# Patient Record
Sex: Female | Born: 1960 | Hispanic: No | Smoking: Never smoker
Health system: Southern US, Community
[De-identification: ages and names within clinical notes are randomized; demographics above are authoritative.]

## PROBLEM LIST (undated history)

## (undated) DIAGNOSIS — T7840XA Allergy, unspecified, initial encounter: Secondary | ICD-10-CM

## (undated) DIAGNOSIS — G43909 Migraine, unspecified, not intractable, without status migrainosus: Secondary | ICD-10-CM

## (undated) DIAGNOSIS — E559 Vitamin D deficiency, unspecified: Secondary | ICD-10-CM

## (undated) DIAGNOSIS — D649 Anemia, unspecified: Secondary | ICD-10-CM

## (undated) HISTORY — DX: Anemia, unspecified: D64.9

## (undated) HISTORY — DX: Vitamin D deficiency, unspecified: E55.9

## (undated) HISTORY — DX: Allergy, unspecified, initial encounter: T78.40XA

## (undated) HISTORY — DX: Migraine, unspecified, not intractable, without status migrainosus: G43.909

---

## 1989-11-20 HISTORY — PX: ABDOMINAL HYSTERECTOMY: SHX81

## 2000-08-08 ENCOUNTER — Other Ambulatory Visit: Admission: RE | Admit: 2000-08-08 | Discharge: 2000-08-08 | Payer: Self-pay | Admitting: Obstetrics & Gynecology

## 2003-05-28 ENCOUNTER — Other Ambulatory Visit: Admission: RE | Admit: 2003-05-28 | Discharge: 2003-05-28 | Payer: Self-pay | Admitting: Obstetrics & Gynecology

## 2005-02-23 ENCOUNTER — Other Ambulatory Visit: Admission: RE | Admit: 2005-02-23 | Discharge: 2005-02-23 | Payer: Self-pay | Admitting: Obstetrics & Gynecology

## 2013-10-02 ENCOUNTER — Encounter: Payer: Self-pay | Admitting: Internal Medicine

## 2013-10-02 DIAGNOSIS — T7840XA Allergy, unspecified, initial encounter: Secondary | ICD-10-CM | POA: Insufficient documentation

## 2013-10-02 DIAGNOSIS — D649 Anemia, unspecified: Secondary | ICD-10-CM | POA: Insufficient documentation

## 2013-10-02 DIAGNOSIS — G43909 Migraine, unspecified, not intractable, without status migrainosus: Secondary | ICD-10-CM | POA: Insufficient documentation

## 2013-10-02 DIAGNOSIS — E559 Vitamin D deficiency, unspecified: Secondary | ICD-10-CM | POA: Insufficient documentation

## 2013-10-03 ENCOUNTER — Ambulatory Visit: Payer: BC Managed Care – PPO | Admitting: Physician Assistant

## 2013-10-03 ENCOUNTER — Encounter: Payer: Self-pay | Admitting: Physician Assistant

## 2013-10-03 VITALS — BP 108/70 | HR 72 | Temp 98.1°F | Resp 16 | Ht 70.0 in | Wt 137.0 lb

## 2013-10-03 DIAGNOSIS — R1904 Left lower quadrant abdominal swelling, mass and lump: Secondary | ICD-10-CM

## 2013-10-03 DIAGNOSIS — N3000 Acute cystitis without hematuria: Secondary | ICD-10-CM

## 2013-10-03 DIAGNOSIS — Z23 Encounter for immunization: Secondary | ICD-10-CM

## 2013-10-03 MED ORDER — CIPROFLOXACIN HCL 500 MG PO TABS: 500.0000 mg | ORAL_TABLET | Freq: Two times a day (BID) | ORAL | Status: AC

## 2013-10-03 MED ORDER — LEVOCETIRIZINE DIHYDROCHLORIDE 5 MG PO TABS: 5.0000 mg | ORAL_TABLET | Freq: Every evening | ORAL | Status: DC

## 2013-10-03 MED ORDER — MOMETASONE FUROATE 50 MCG/ACT NA SUSP: 2.0000 | Freq: Every day | NASAL | Status: DC

## 2013-10-03 NOTE — Progress Notes (Signed)
  Subjective:    Patient ID: Margaret Bartlett, female    DOB: [birth year], 52 y.o.   MRN: [id number]  Dysuria  This is a new problem. The current episode started in the past 7 days. The problem occurs every urination. The problem has been gradually worsening. The quality of the pain is described as burning (at the end). The pain is moderate. There has been no fever. She is sexually active. There is no history of pyelonephritis. Associated symptoms include flank pain (left side), frequency, hematuria (brownish), hesitancy and urgency. Pertinent negatives include no chills, discharge, nausea, possible pregnancy, sweats or vomiting. She has tried increased fluids for the symptoms. The treatment provided mild relief. Her past medical history is significant for recurrent UTIs. There is no history of kidney stones.    Past Medical History  Diagnosis Date  . Allergy   . Anemia   . Migraine   . Vitamin D deficiency      Medication List       This list is accurate as of: 10/03/13 10:30 AM.  Always use your most recent med list.               levocetirizine 5 MG tablet  Commonly known as:  XYZAL  Take 5 mg by mouth every evening.     mometasone 50 MCG/ACT nasal spray  Commonly known as:  NASONEX  Place 2 sprays into the nose daily.         Review of Systems  Constitutional: Negative.  Negative for chills.  Respiratory: Negative.   Cardiovascular: Negative.   Gastrointestinal: Positive for abdominal pain (suprapubic). Negative for nausea and vomiting.  Genitourinary: Positive for dysuria, hesitancy, urgency, frequency, hematuria (brownish) and flank pain (left side).  Musculoskeletal: Negative.   Skin: Negative.        Objective:   Physical Exam  Constitutional: She appears well-developed and well-nourished.  HENT:  Head: Normocephalic and atraumatic.  Neck: Normal range of motion. Neck supple.  Cardiovascular: Normal rate and regular rhythm.   Pulmonary/Chest: Effort normal and  breath sounds normal.  Abdominal: Soft. She exhibits mass (On left lower quadrant questionable). She exhibits no distension. There is tenderness. There is no rebound.  Musculoskeletal: Normal range of motion.  Skin: Skin is warm and dry.      Assessment & Plan:  1. Need for prophylactic vaccination and inoculation against influenza - Flu vaccine greater than 3yo with preservative IM (Fluzone trivalent)  2. Abdominal mass, LLQ (left lower quadrant) With abdominal bloating, fatigue, and possible LLQ mass will get an U/S to rule out mass/ovarian cancer.  - US Pelvis Limited; Future - US Transvaginal Non-OB; Future  3. Acute cystitis Hand out given.  - ciprofloxacin (CIPRO) 500 MG tablet; Take 1 tablet (500 mg total) by mouth 2 (two) times daily.  Dispense: 14 tablet; Refill: 0 - Urinalysis, Routine w reflex microscopic - Urine culture

## 2013-10-03 NOTE — Patient Instructions (Signed)
Urinary Tract Infection  Urinary tract infections (UTIs) can develop anywhere along your urinary tract. Your urinary tract is your body's drainage system for removing wastes and extra water. Your urinary tract includes two kidneys, two ureters, a bladder, and a urethra. Your kidneys are a pair of bean-shaped organs. Each kidney is about the size of your fist. They are located below your ribs, one on each side of your spine.  CAUSES  Infections are caused by microbes, which are microscopic organisms, including fungi, viruses, and bacteria. These organisms are so small that they can only be seen through a microscope. Bacteria are the microbes that most commonly cause UTIs.  SYMPTOMS   Symptoms of UTIs may vary by age and gender of the patient and by the location of the infection. Symptoms in young women typically include a frequent and intense urge to urinate and a painful, burning feeling in the bladder or urethra during urination. Older women and men are more likely to be tired, shaky, and weak and have muscle aches and abdominal pain. A fever may mean the infection is in your kidneys. Other symptoms of a kidney infection include pain in your back or sides below the ribs, nausea, and vomiting.  DIAGNOSIS  To diagnose a UTI, your caregiver will ask you about your symptoms. Your caregiver also will ask to provide a urine sample. The urine sample will be tested for bacteria and white blood cells. White blood cells are made by your body to help fight infection.  TREATMENT   Typically, UTIs can be treated with medication. Because most UTIs are caused by a bacterial infection, they usually can be treated with the use of antibiotics. The choice of antibiotic and length of treatment depend on your symptoms and the type of bacteria causing your infection.  HOME CARE INSTRUCTIONS   If you were prescribed antibiotics, take them exactly as your caregiver instructs you. Finish the medication even if you feel better after you  have only taken some of the medication.   Drink enough water and fluids to keep your urine clear or pale yellow.   Avoid caffeine, tea, and carbonated beverages. They tend to irritate your bladder.   Empty your bladder often. Avoid holding urine for long periods of time.   Empty your bladder before and after sexual intercourse.   After a bowel movement, women should cleanse from front to back. Use each tissue only once.  SEEK MEDICAL CARE IF:    You have back pain.   You develop a fever.   Your symptoms do not begin to resolve within 3 days.  SEEK IMMEDIATE MEDICAL CARE IF:    You have severe back pain or lower abdominal pain.   You develop chills.   You have nausea or vomiting.   You have continued burning or discomfort with urination.  MAKE SURE YOU:    Understand these instructions.   Will watch your condition.   Will get help right away if you are not doing well or get worse.  Document Released: 08/16/2005 Document Revised: 05/07/2012 Document Reviewed: 12/15/2011  [employer] Patient Information 2014 [employer].

## 2013-10-04 LAB — URINALYSIS, ROUTINE W REFLEX MICROSCOPIC
Bilirubin Urine: NEGATIVE
Glucose, UA: NEGATIVE mg/dL
Hgb urine dipstick: NEGATIVE
Ketones, ur: NEGATIVE mg/dL
Nitrite: NEGATIVE
Protein, ur: NEGATIVE mg/dL
Specific Gravity, Urine: 1.018 (ref 1.005–1.030)
pH: 7 (ref 5.0–8.0)

## 2013-12-26 ENCOUNTER — Other Ambulatory Visit: Payer: Self-pay | Admitting: Physician Assistant

## 2013-12-26 MED ORDER — NEOMYCIN-POLYMYXIN-DEXAMETH 3.5-10000-0.1 OP SUSP: 2.0000 [drp] | Freq: Four times a day (QID) | OPHTHALMIC | Status: DC

## 2014-01-16 ENCOUNTER — Ambulatory Visit (INDEPENDENT_AMBULATORY_CARE_PROVIDER_SITE_OTHER): Payer: BC Managed Care – PPO | Admitting: Physician Assistant

## 2014-01-16 ENCOUNTER — Encounter: Payer: Self-pay | Admitting: Physician Assistant

## 2014-01-16 VITALS — BP 112/62 | HR 76 | Temp 98.2°F | Resp 16 | Ht 69.0 in | Wt 134.0 lb

## 2014-01-16 DIAGNOSIS — M26609 Unspecified temporomandibular joint disorder, unspecified side: Secondary | ICD-10-CM

## 2014-01-16 DIAGNOSIS — J329 Chronic sinusitis, unspecified: Secondary | ICD-10-CM

## 2014-01-16 MED ORDER — PREDNISONE 20 MG PO TABS: ORAL_TABLET | ORAL | Status: DC

## 2014-01-16 MED ORDER — BEPOTASTINE BESILATE 1.5 % OP SOLN: OPHTHALMIC | Status: DC

## 2014-01-16 MED ORDER — AZITHROMYCIN 250 MG PO TABS: ORAL_TABLET | ORAL | Status: DC

## 2014-01-16 NOTE — Patient Instructions (Signed)
What is the TMJ? The temporomandibular (tem-PUH-ro-man-DIB-yoo-ler) joint, or the TMJ, connects the upper and lower jawbones. This joint allows the jaw to open wide and move back and forth when you chew, talk, or yawn.There are also several muscles that help this joint move. There can be muscle tightness and pain in the muscle that can cause several symptoms.  What causes TMJ pain? There are many causes of TMJ pain. Repeated chewing (for example, chewing gum) and clenching your teeth can cause pain in the joint. Some TMJ pain has no obvious cause. What can I do to ease the pain? There are many things you can do to help your pain get better. When you have pain:  Eat soft foods and stay away from chewy foods (for example, taffy) Try to use both sides of your mouth to chew Don't chew gum Don't open your mouth wide (for example, during yawning or singing) Don't bite your cheeks or fingernails Lower your amount of stress and worry Applying a warm, damp washcloth to the joint may help. Over-the-counter pain medicines such as ibuprofen (one brand: Advil) or acetaminophen (one brand: Tylenol) might also help. Do not use these medicines if you are allergic to them or if your doctor told you not to use them. How can I stop the pain from coming back? When your pain is better, you can do these exercises to make your muscles stronger and to keep the pain from coming back:  Resisted mouth opening: Place your thumb or two fingers under your chin and open your mouth slowly, pushing up lightly on your chin with your thumb. Hold for three to six seconds. Close your mouth slowly. Resisted mouth closing: Place your thumbs under your chin and your two index fingers on the ridge between your mouth and the bottom of your chin. Push down lightly on your chin as you close your mouth. Tongue up: Slowly open and close your mouth while keeping the tongue touching the roof of the mouth. Side-to-side jaw movement: Place an  object about one fourth of an inch thick (for example, two tongue depressors) between your front teeth. Slowly move your jaw from side to side. Increase the thickness of the object as the exercise becomes easier Forward jaw movement: Place an object about one fourth of an inch thick between your front teeth and move the bottom jaw forward so that the bottom teeth are in front of the top teeth. Increase the thickness of the object as the exercise becomes easier. These exercises should not be painful. If it hurts to do these exercises, stop doing them and talk to your family doctor.   The majority of colds are caused by viruses and do not require antibiotics. Please read the rest of this hand out to learn more about the common cold and what you can do to help yourself as well as help prevent the over use of antibiotics.   COMMON COLD SIGNS AND SYMPTOMS - The common cold usually causes nasal congestion, runny nose, and sneezing. A sore throat may be present on the first day but usually resolves quickly. If a cough occurs, it generally develops on about the fourth or fifth day of symptoms, typically when congestion and runny nose are resolving  COMMON COLD COMPLICATIONS - In most cases, colds do not cause serious illness or complications. Most colds last for three to seven days, although many people continue to have symptoms (coughing, sneezing, congestion) for up to two weeks.  One of the   more common complications is sinusitis, which is usually caused by viruses and rarely (about 2 percent of the time) by bacteria. Having thick or yellow to green-colored nasal discharge does not mean that bacterial sinusitis has developed; discolored nasal discharge is a normal phase of the common cold.  Lower respiratory infections, such as pneumonia or bronchitis, may develop following a cold.  Infection of the middle ear, or otitis media, can accompany or follow a cold.  COMMON COLD TREATMENT - There is no specific  treatment for the viruses that cause the common cold. Most treatments are aimed at relieving some of the symptoms of the cold, but do not shorten or cure the cold. Antibiotics are not useful for treating the common cold; antibiotics are only used to treat illnesses caused by bacteria, not viruses. Unnecessary use of antibiotics for the treatment of the common cold can cause allergic reactions, diarrhea, or other gastrointestinal symptoms in some patients.  The symptoms of a cold will resolve over time, even without any treatment. People with underlying medical conditions and those who use other over-the-counter or prescription medications should speak with their healthcare provider or pharmacist to ensure that it is safe to use these treatments. The following are treatments that may reduce the symptoms caused by the common cold.  Nasal congestion - Decongestants are good for nasal congestion- if you feel very stuffy but no mucus is coming out, this is the medication that will help you the most.  Pseudoephedrine is a decongestant that can improve nasal congestion. Although a prescription is not required, drugstores in the [place] keep pseudoephedrine behind the counter, so it must be requested from a pharmacist. If you have a heart condition or high blood pressure please use Coricidin BPH instead.   Runny nose - Antihistamines such as diphenhydramine (Benadryl), certazine (Zyrtec) which are best taking at night because they can make you tired OR loratadine (Claritin),  fexafinadine (Allegra) help with a runny nose.   Nasal sprays such an oxymetazoline (Afrin and others) may also give temporary relief of nasal congestion. However, these sprays should never be used for more than two to three days; use for more than three days use can worsen congestion.  Nasocort is now over the counter and can help decrease a runny nose. Please stop the medication if you have blurry vision or nose bleeds.   Sore throat  and headache - Sore throat and headache are best treated with a mild pain reliever such as acetaminophen (Tylenol) or a non-steroidal anti-inflammatory agent such as ibuprofen or naproxen (Motrin or Aleve). These medications should be taken with food to prevent stomach problems. As well as gargling with warm water and salt.   Cough - Common cough medicine ingredients include guaifenesin and dextromethorphan; these are often combined with other medications in over-the-counter cold formulas. Often a cough is worse at night or first in the morning due to post nasal drip from you nose. You can try to sleep at an angle to decrease a cough.   Alternative treatments - Heated, humidified air can improve symptoms of nasal congestion and runny nose, and causes few to no side effects. A number of alternative products, including vitamin C, doubling up on your vitamin D and herbal products such as echinacea, may help. Certain products, such as nasal gels that contain zinc (eg, Zicam), have been associated with a permanent loss of smell.  Antibiotics - Antibiotics should not be used to treat an uncomplicated common cold. As noted above, colds   are caused by viruses. Antibiotics treat bacterial, not viral infections. Some viruses that cause the common cold can also depress the immune system or cause swelling in the lining of the nose or airways; this can, in turn, lead to a bacterial infection. Often you need to give your body 7 days to fight off a common cold while treating the symptoms with the medications listed above. If after 7 days your symptoms are not improving, you are getting worse, you have shortness of breath, chest pain, a fever of over 103 you should seek medical help immediately.   PREVENTION IS THE BEST MEDICINE - Hand washing is an essential and highly effective way to prevent the spread of infection.  Alcohol-based hand rubs are a good alternative for disinfecting hands if a sink is not available.    Hands should be washed before preparing food and eating and after coughing, blowing the nose, or sneezing. While it is not always possible to limit contact with people who may be infected with a cold, touching the eyes, nose, or mouth after direct contact should be avoided when possible. Sneezing/coughing into the sleeve of one's clothing (at the inner elbow) is another means of containing sprays of saliva and secretions and does not contaminate the hands.     

## 2014-01-16 NOTE — Progress Notes (Signed)
   Subjective:    Patient ID: Margaret Bartlett, female    DOB: [birth year], 53 y.o.   MRN: [id number]  Sinus Problem This is a new problem. The current episode started in the past 7 days. The problem has been gradually worsening since onset. There has been no fever. Associated symptoms include congestion, headaches and sinus pressure (yellow with blood streaks). Pertinent negatives include no chills, coughing, diaphoresis, ear pain, hoarse voice, neck pain, shortness of breath, sneezing, sore throat or swollen glands. Past treatments include lying down and oral decongestants. The treatment provided mild relief.    Review of Systems  Constitutional: Negative for fever, chills and diaphoresis.  HENT: Positive for congestion and sinus pressure (yellow with blood streaks). Negative for ear pain, hoarse voice, sneezing, sore throat, tinnitus, trouble swallowing and voice change.   Respiratory: Negative.  Negative for cough and shortness of breath.   Cardiovascular: Negative.   Genitourinary: Negative.   Musculoskeletal: Negative.  Negative for neck pain.  Neurological: Positive for headaches.      Objective:   Physical Exam  Constitutional: She appears well-developed and well-nourished.  HENT:  Head: Normocephalic and atraumatic.  Right Ear: External ear normal.  Nose: Right sinus exhibits maxillary sinus tenderness. Right sinus exhibits no frontal sinus tenderness. Left sinus exhibits maxillary sinus tenderness. Left sinus exhibits no frontal sinus tenderness.  TMJ  Eyes: Conjunctivae and EOM are normal.  Neck: Normal range of motion. Neck supple.  Cardiovascular: Normal rate, regular rhythm, normal heart sounds and intact distal pulses.   Pulmonary/Chest: Effort normal and breath sounds normal. No respiratory distress. She has no wheezes.  Abdominal: Soft. Bowel sounds are normal.  Lymphadenopathy:    She has no cervical adenopathy.  Skin: Skin is warm and dry.       Assessment & Plan:   TMJ (temporomandibular joint syndrome)- information given to the patient, no gum/decrease hard foods, warm wet wash clothes, decrease stress, talk with dentist about possible night guard, can do massage, and exercise.   Sinusitis/allergies-Allegra OTC, increase H20, allergy hygiene explained, Prednisone, zpak.

## 2014-05-04 ENCOUNTER — Other Ambulatory Visit: Payer: Self-pay | Admitting: Physician Assistant

## 2014-05-04 MED ORDER — LEVOCETIRIZINE DIHYDROCHLORIDE 5 MG PO TABS: 5.0000 mg | ORAL_TABLET | Freq: Every evening | ORAL | Status: DC

## 2014-05-11 NOTE — Progress Notes (Signed)
#  1 = LMOM TO CALL & SCHEDULE - 05/12/15 SB

## 2014-10-31 ENCOUNTER — Other Ambulatory Visit: Payer: Self-pay | Admitting: Physician Assistant

## 2014-11-05 DIAGNOSIS — K5900 Constipation, unspecified: Secondary | ICD-10-CM | POA: Insufficient documentation

## 2014-11-25 ENCOUNTER — Ambulatory Visit (INDEPENDENT_AMBULATORY_CARE_PROVIDER_SITE_OTHER): Payer: BLUE CROSS/BLUE SHIELD | Admitting: Physician Assistant

## 2014-11-25 ENCOUNTER — Encounter: Payer: Self-pay | Admitting: Physician Assistant

## 2014-11-25 VITALS — BP 120/80 | HR 88 | Temp 100.0°F | Resp 16 | Ht 69.0 in | Wt 140.0 lb

## 2014-11-25 DIAGNOSIS — E049 Nontoxic goiter, unspecified: Secondary | ICD-10-CM

## 2014-11-25 DIAGNOSIS — J0100 Acute maxillary sinusitis, unspecified: Secondary | ICD-10-CM

## 2014-11-25 DIAGNOSIS — E041 Nontoxic single thyroid nodule: Secondary | ICD-10-CM

## 2014-11-25 DIAGNOSIS — R040 Epistaxis: Secondary | ICD-10-CM

## 2014-11-25 MED ORDER — AZITHROMYCIN 250 MG PO TABS: ORAL_TABLET | ORAL | Status: DC

## 2014-11-25 NOTE — Patient Instructions (Signed)
-Please take Tylenol or Ibuprofen for pain. -Acetaminiphen 325mg orally every 4-6 hours for pain.  Max: 10 per day -Ibuprofen 200mg orally every 6-8 hours for pain.  Take with food to avoid ulcers.   Max 10 per day  Please pick one of the over the counter allergy medications below and take it once daily for allergies.  Claritin or loratadine cheapest but likely the weakest  Zyrtec or certizine at night because it can make you sleepy The strongest is allegra or fexafinadine  Cheapest at [facility], [employer], [employer]  -While drinking fluids, pinch and hold nose close and swallow.  This will help open up your eustachian tubes to drain the fluid behind your ear drums. -Try steam showers to open your nasal passages.   Drink lots of water to stay hydrated and to thin mucous.  Flonase/Nasonex is to help the inflammation.  Take 2 sprays in each nostril at bedtime.  Make sure you spray towards the outside of each nostril towards the outer corner of your eye, hold nose close and tilt head back.  This will help the medication get into your sinuses.  If you do not like this medication, then use saline nasal sprays same directions as above for Flonase. Stop the medication right away if you get blurring of your vision or nose bleeds.  -It can take up to 2 weeks to feel better.  Sinusitis is mostly caused by viruses.  -If you do not get better in 7-10 days (Have fever, facial pain, dental pain and swelling), then please call the office and I can send in an antibiotic.  Sinusitis Sinusitis is redness, soreness, and inflammation of the paranasal sinuses. Paranasal sinuses are air pockets within the bones of your face (beneath the eyes, the middle of the forehead, or above the eyes). In healthy paranasal sinuses, mucus is able to drain out, and air is able to circulate through them by way of your nose. However, when your paranasal sinuses are inflamed, mucus and air can become trapped. This can allow bacteria and other  germs to grow and cause infection. Sinusitis can develop quickly and last only a short time (acute) or continue over a long period (chronic). Sinusitis that lasts for more than 12 weeks is considered chronic.  CAUSES  Causes of sinusitis include:  Allergies.  Structural abnormalities, such as displacement of the cartilage that separates your nostrils (deviated septum), which can decrease the air flow through your nose and sinuses and affect sinus drainage.  Functional abnormalities, such as when the small hairs (cilia) that line your sinuses and help remove mucus do not work properly or are not present. SIGNS AND SYMPTOMS  Symptoms of acute and chronic sinusitis are the same. The primary symptoms are pain and pressure around the affected sinuses. Other symptoms include:  Upper toothache.  Earache.  Headache.  Bad breath.  Decreased sense of smell and taste.  A cough, which worsens when you are lying flat.  Fatigue.  Fever.  Thick drainage from your nose, which often is green and may contain pus (purulent).  Swelling and warmth over the affected sinuses. DIAGNOSIS  Your health care provider will perform a physical exam. During the exam, your health care provider may:  Look in your nose for signs of abnormal growths in your nostrils (nasal polyps).  Tap over the affected sinus to check for signs of infection.  View the inside of your sinuses (endoscopy) using an imaging device that has a light attached (endoscope). If your   health care provider suspects that you have chronic sinusitis, one or more of the following tests may be recommended:  Allergy tests.  Nasal culture. A sample of mucus is taken from your nose, sent to a lab, and screened for bacteria.  Nasal cytology. A sample of mucus is taken from your nose and examined by your health care provider to determine if your sinusitis is related to an allergy. TREATMENT  Most cases of acute sinusitis are related to a viral  infection and will resolve on their own within 10 days. Sometimes medicines are prescribed to help relieve symptoms (pain medicine, decongestants, nasal steroid sprays, or saline sprays).  However, for sinusitis related to a bacterial infection, your health care provider will prescribe antibiotic medicines. These are medicines that will help kill the bacteria causing the infection.  Rarely, sinusitis is caused by a fungal infection. In theses cases, your health care provider will prescribe antifungal medicine. For some cases of chronic sinusitis, surgery is needed. Generally, these are cases in which sinusitis recurs more than 3 times per year, despite other treatments. HOME CARE INSTRUCTIONS   Drink plenty of water. Water helps thin the mucus so your sinuses can drain more easily.  Use a humidifier.  Inhale steam 3 to 4 times a day (for example, sit in the bathroom with the shower running).  Apply a warm, moist washcloth to your face 3 to 4 times a day, or as directed by your health care provider.  Use saline nasal sprays to help moisten and clean your sinuses.  Take medicines only as directed by your health care provider.  If you were prescribed either an antibiotic or antifungal medicine, finish it all even if you start to feel better. SEEK IMMEDIATE MEDICAL CARE IF:  You have increasing pain or severe headaches.  You have nausea, vomiting, or drowsiness.  You have swelling around your face.  You have vision problems.  You have a stiff neck.  You have difficulty breathing. MAKE SURE YOU:   Understand these instructions.  Will watch your condition.  Will get help right away if you are not doing well or get worse. Document Released: 11/06/2005 Document Revised: 03/23/2014 Document Reviewed: 11/21/2011 [facility] Patient Information 2015 [employer] [place]. This information is not intended to replace advice given to you by your health care provider. Make sure you discuss any  questions you have with your health care provider.  Multinodular thyroid goiter or Multinodular thyroid gland.  Goiter means thyroid enlargement so if your thyroid is enlarged it is multinodular thyroid goiter or otherwise it is called multinodular thyroid gland.  It is very common, in more than 10 % of the population. [place] The great majority are completely benign. We do not recommend biopsy until the nodules reach 15mm. Otherwise we monitor your thyroid with an ultrasound and repeated blood work monitoring its function. If they nodules are not increasing in size, we often stop monitoring with ultrasound.

## 2014-11-25 NOTE — Progress Notes (Signed)
Subjective:    Patient ID: Marisa CyphersSheila Puls, female    DOB: [birth year], 54 y.o.   MRN: [id number]  Sinus Problem This is a new problem. The current episode started in the past 7 days. The problem is unchanged. The maximum temperature recorded prior to her arrival was 100.4 - 100.9 F. The fever has been present for less than 1 day. She is experiencing no pain. Associated symptoms include congestion, sinus pressure and a sore throat (this AM). Pertinent negatives include no chills, coughing, diaphoresis, ear pain, headaches, hoarse voice, neck pain, shortness of breath, sneezing or swollen glands. (Nose bleeds yesterday and today) Treatments tried: nasonex, saline spray, xyzal. The treatment provided no relief.   Also patient went to OB/GYN, states that thyroid is enlarged and get it checked out. Has CPE in March.   Current Outpatient Prescriptions on File Prior to Visit  Medication Sig Dispense Refill  . Bepotastine Besilate 1.5 % SOLN 1-2 drops in the affected eye twice a day as needed 5 mL 3  . levocetirizine (XYZAL) 5 MG tablet Take 1 tablet (5 mg total) by mouth every evening. 90 tablet 0  . mometasone (NASONEX) 50 MCG/ACT nasal spray Place 2 sprays into the nose daily. 17 g 5   No current facility-administered medications on file prior to visit.   Past Medical History  Diagnosis Date  . Allergy   . Anemia   . Migraine   . Vitamin D deficiency     Review of Systems  Constitutional: Negative for chills and diaphoresis.  HENT: Positive for congestion, nosebleeds, postnasal drip, sinus pressure and sore throat (this AM). Negative for dental problem, drooling, ear discharge, ear pain, facial swelling, hearing loss, hoarse voice, mouth sores, rhinorrhea, sneezing, tinnitus, trouble swallowing and voice change.   Respiratory: Negative.  Negative for cough and shortness of breath.   Cardiovascular: Negative.   Gastrointestinal: Negative.   Genitourinary: Negative.   Musculoskeletal:  Negative.  Negative for neck pain.  Neurological: Negative.  Negative for headaches.  Hematological: Negative.   Psychiatric/Behavioral: Negative.        Objective:   Physical Exam  Constitutional: She appears well-developed and well-nourished.  HENT:  Head: Normocephalic and atraumatic.  Right Ear: External ear normal.  Nose: Mucosal edema and sinus tenderness present. No nose lacerations, nasal deformity, septal deviation or nasal septal hematoma. No epistaxis.  No foreign bodies. Right sinus exhibits no maxillary sinus tenderness and no frontal sinus tenderness. Left sinus exhibits no maxillary sinus tenderness and no frontal sinus tenderness.  + dried blood bilateral nares, no source of bleeding  Eyes: Conjunctivae and EOM are normal.  Neck: Normal range of motion. Neck supple. Thyromegaly (with nodule on left side) present.  Cardiovascular: Normal rate, regular rhythm, normal heart sounds and intact distal pulses.   Pulmonary/Chest: Effort normal and breath sounds normal. No respiratory distress. She has no wheezes.  Abdominal: Soft. Bowel sounds are normal.  Lymphadenopathy:    She has cervical adenopathy.  Skin: Skin is warm and dry.    Blood pressure 120/80, pulse 88, temperature 100 F (37.8 C), resp. rate 16, height 5\' 9"  (1.753 m), weight 140 lb (63.504 kg).     Assessment & Plan:  1. Epistaxis/ Acute maxillary sinusitis, recurrence not specified Hold nasonex, continue saline spray.  Will hold the zpak and take if she is not getting better, increase fluids, rest, cont allergy pill - CBC with Differential - azithromycin (ZITHROMAX) 250 MG tablet; 2 tablets by mouth today then one  tablet daily for 4 days.  Dispense: 6 tablet; Refill: 1  3. Thyroid goiter/nodule - TSH - US Soft Tissue Head/Neck; Future

## 2014-11-26 LAB — CBC WITH DIFFERENTIAL/PLATELET
Basophils Absolute: 0.1 10*3/uL (ref 0.0–0.1)
Basophils Relative: 1 % (ref 0–1)
EOS PCT: 2 % (ref 0–5)
Eosinophils Absolute: 0.1 10*3/uL (ref 0.0–0.7)
HCT: 35.3 % — ABNORMAL LOW (ref 36.0–46.0)
Hemoglobin: 12 g/dL (ref 12.0–15.0)
LYMPHS ABS: 2.4 10*3/uL (ref 0.7–4.0)
LYMPHS PCT: 48 % — AB (ref 12–46)
MCH: 30.6 pg (ref 26.0–34.0)
MCHC: 34 g/dL (ref 30.0–36.0)
MCV: 90.1 fL (ref 78.0–100.0)
MONO ABS: 0.4 10*3/uL (ref 0.1–1.0)
MONOS PCT: 7 % (ref 3–12)
MPV: 9.8 fL (ref 8.6–12.4)
Neutro Abs: 2.1 10*3/uL (ref 1.7–7.7)
Neutrophils Relative %: 42 % — ABNORMAL LOW (ref 43–77)
PLATELETS: 271 10*3/uL (ref 150–400)
RBC: 3.92 MIL/uL (ref 3.87–5.11)
RDW: 13.4 % (ref 11.5–15.5)
WBC: 5 10*3/uL (ref 4.0–10.5)

## 2014-11-26 LAB — TSH: TSH: 1.382 u[IU]/mL (ref 0.350–4.500)

## 2014-12-07 ENCOUNTER — Other Ambulatory Visit: Payer: Self-pay

## 2014-12-14 ENCOUNTER — Encounter: Payer: Self-pay | Admitting: Physician Assistant

## 2014-12-14 ENCOUNTER — Other Ambulatory Visit: Payer: Self-pay | Admitting: Physician Assistant

## 2014-12-14 DIAGNOSIS — E041 Nontoxic single thyroid nodule: Secondary | ICD-10-CM

## 2014-12-14 NOTE — Progress Notes (Signed)
+   abnormal thyroid US, will send for evaluation of biopsy since nodules measure above 15mm.

## 2015-01-06 ENCOUNTER — Telehealth (INDEPENDENT_AMBULATORY_CARE_PROVIDER_SITE_OTHER): Payer: BLUE CROSS/BLUE SHIELD | Admitting: Physician Assistant

## 2015-01-06 DIAGNOSIS — J329 Chronic sinusitis, unspecified: Secondary | ICD-10-CM

## 2015-01-06 MED ORDER — PREDNISONE 20 MG PO TABS: ORAL_TABLET | ORAL | Status: DC

## 2015-01-06 MED ORDER — LEVOFLOXACIN 500 MG PO TABS: 500.0000 mg | ORAL_TABLET | Freq: Every day | ORAL | Status: DC

## 2015-01-06 NOTE — Telephone Encounter (Signed)
EVISIT TELEPHONE Patient called the office for a telephone visit complaining of symptoms of a URI. Symptoms include productive cough with  clear colored sputum, sore throat and fever, chills, red, itchy eyes. Onset of symptoms was 4 days ago, and has been gradually worsening since that time. Treatment to date: antihistamines, decongestants and ibuprofen.  PLAN: URI- Discussed diagnosis and treatment of URI. Discussed the importance of avoiding unnecessary antibiotic therapy. Suggested symptomatic OTC remedies. Nasal saline spray for congestion. Levaquin per orders. Follow up as needed. Call in 10 days if symptoms aren't resolving.

## 2015-01-16 ENCOUNTER — Encounter: Payer: Self-pay | Admitting: *Deleted

## 2015-01-17 ENCOUNTER — Encounter: Payer: Self-pay | Admitting: Physician Assistant

## 2015-01-19 ENCOUNTER — Encounter: Payer: Self-pay | Admitting: Emergency Medicine

## 2015-01-19 ENCOUNTER — Emergency Department (INDEPENDENT_AMBULATORY_CARE_PROVIDER_SITE_OTHER)
Admission: EM | Admit: 2015-01-19 | Discharge: 2015-01-19 | Disposition: A | Discharge status: Home / Self Care | Payer: BLUE CROSS/BLUE SHIELD | Source: Home / Self Care | Attending: Emergency Medicine | Admitting: Emergency Medicine

## 2015-01-19 ENCOUNTER — Emergency Department (INDEPENDENT_AMBULATORY_CARE_PROVIDER_SITE_OTHER): Payer: BLUE CROSS/BLUE SHIELD

## 2015-01-19 DIAGNOSIS — T1490 Injury, unspecified: Secondary | ICD-10-CM

## 2015-01-19 DIAGNOSIS — H209 Unspecified iridocyclitis: Secondary | ICD-10-CM

## 2015-01-19 DIAGNOSIS — T1490XA Injury, unspecified, initial encounter: Secondary | ICD-10-CM

## 2015-01-19 DIAGNOSIS — S0093XA Contusion of unspecified part of head, initial encounter: Secondary | ICD-10-CM

## 2015-01-19 DIAGNOSIS — H0500 Unspecified acute inflammation of orbit: Secondary | ICD-10-CM

## 2015-01-19 MED ORDER — HYDROCODONE-ACETAMINOPHEN 5-325 MG PO TABS: 2.0000 | ORAL_TABLET | ORAL | Status: DC | PRN

## 2015-01-19 MED ORDER — CIPROFLOXACIN HCL 0.3 % OP SOLN: 2.0000 [drp] | OPHTHALMIC | Status: DC

## 2015-01-19 NOTE — ED Provider Notes (Signed)
CSN: [id number]     Arrival date & time 01/19/15  1129 History   First MD Initiated Contact with Patient 01/19/15 1205     Chief Complaint  Patient presents with  . Eye Pain   (Consider location/radiation/quality/duration/timing/severity/associated sxs/prior Treatment) Patient is a 54 y.o. female presenting with head injury. The history is provided by the patient. No language interpreter was used.  Head Injury Location:  Frontal Time since incident:  4 days Mechanism of injury: direct blow   Pain details:    Quality:  Aching   Radiates to:  Face   Severity:  Moderate   Timing:  Constant   Progression:  Worsening Chronicity:  New Relieved by:  Nothing Worsened by:  Nothing tried Ineffective treatments:  None tried Associated symptoms: blurred vision and neck pain   Associated symptoms: no double vision   Pt complains of right eye redness  Past Medical History  Diagnosis Date  . Allergy   . Anemia   . Migraine   . Vitamin D deficiency    Past Surgical History  Procedure Laterality Date  . Abdominal hysterectomy  1991   Family History  Problem Relation Age of Onset  . Brain cancer Mother   . Depression Father   . Stroke Father   . Mental illness Father    History  Substance Use Topics  . Smoking status: Never Smoker   . Smokeless tobacco: Never Used  . Alcohol Use: Yes   OB History    No data available     Review of Systems  Eyes: Positive for blurred vision. Negative for double vision.  Musculoskeletal: Positive for neck pain.  All other systems reviewed and are negative.   Allergies  Review of patient's allergies indicates not on file.  Home Medications   Prior to Admission medications   Medication Sig Start Date End Date Taking? Authorizing Provider  Bepotastine Besilate 1.5 % SOLN 1-2 drops in the affected eye twice a day as needed 01/16/14   Quentin Mulling, PA-C  estradiol (VIVELLE-DOT) 0.1 MG/24HR patch Place 1 patch onto the skin 2 (two) times a  week.    Historical Provider, MD  levocetirizine (XYZAL) 5 MG tablet Take 1 tablet (5 mg total) by mouth every evening. 05/04/14   Quentin Mulling, PA-C  levofloxacin (LEVAQUIN) 500 MG tablet Take 1 tablet (500 mg total) by mouth daily. 01/06/15   Quentin Mulling, PA-C  mometasone (NASONEX) 50 MCG/ACT nasal spray Place 2 sprays into the nose daily. 10/03/13   Quentin Mulling, PA-C  predniSONE (DELTASONE) 20 MG tablet 2 tablets daily for 3 days, 1 tablet daily for 4 days. 01/06/15   Quentin Mulling, PA-C   BP 107/70 mmHg  Pulse 82  Temp(Src) 98.1 F (36.7 C) (Oral)  Ht  (1.778 m)  Wt 136 lb (61.689 kg)  BMI 19.51 kg/m2  SpO2 100% Physical Exam  Constitutional: She is oriented to person, place, and time. She appears well-developed and well-nourished.  HENT:  Head: Normocephalic.  Right Ear: External ear normal.  Nose: Nose normal.  Eyes: EOM are normal. Pupils are equal, round, and reactive to light.  Injected right conjunctiva   Neck: Normal range of motion.  Cardiovascular: Normal rate.   Pulmonary/Chest: Effort normal.  Musculoskeletal: Normal range of motion.  Neurological: She is alert and oriented to person, place, and time. She has normal reflexes.  Skin: Skin is warm.  Psychiatric: She has a normal mood and affect.  Nursing note and vitals reviewed.  ED Course  Procedures (including critical care time) Labs Review Labs Reviewed - No data to display  Imaging Review Ct Head Wo Contrast  01/19/2015   CLINICAL DATA:  Football injury 3 days ago, hit right face with football 3 days ago, right eye pain and redness  EXAM: CT HEAD AND ORBITS WITHOUT CONTRAST  TECHNIQUE: Contiguous axial images were obtained from the base of the skull through the vertex without contrast. Multidetector CT imaging of the orbits was performed using the standard protocol without intravenous contrast.  COMPARISON:  None.  FINDINGS: CT HEAD FINDINGS  No skull fracture is noted. Minimal mucosal thickening  posterior aspect of the left maxillary sinus. The mastoid air cells are unremarkable.  No intracranial hemorrhage, mass effect or midline shift. No hydrocephalus. The gray and white-matter differentiation is preserved. No acute infarction. No mass lesion is noted on this unenhanced scan.  CT ORBITS FINDINGS  There is subtle mild soft tissue swelling right preorbital. No zygomatic fracture is noted. No paranasal sinuses air-fluid levels. Minimal mucosal thickening posterior aspect of the left maxillary sinus.  No nasal bone fracture is noted.  No intraorbital hematoma. Coronal images shows no orbital rim or orbital floor fracture. Bilateral eye globe is symmetrical in appearance. There is no TMJ dislocation. The nasal septum is midline. Bilateral semilunar canal is patent. The nasal turbinates are unremarkable. Sagittal images shows no maxillary spine fracture. The nasopharyngeal and oropharyngeal airway is patent.  IMPRESSION: 1. No acute intracranial abnormality. 2. No orbital rim or orbital floor fracture. Minimal soft tissue swelling right preorbital. No intraorbital hematoma. Bilateral eye globe is symmetrical in appearance. 3. Minimal mucosal thickening posterior aspect of the left maxillary sinus. No paranasal sinuses air-fluid levels. 4. Patent nasopharyngeal and oropharyngeal airway. Unremarkable nasal turbinates.   Electronically Signed   By: Natasha Mead M.D.   On: 01/19/2015 13:19   Ct Orbitss W/o Cm  01/19/2015   CLINICAL DATA:  Football injury 3 days ago, hit right face with football 3 days ago, right eye pain and redness  EXAM: CT HEAD AND ORBITS WITHOUT CONTRAST  TECHNIQUE: Contiguous axial images were obtained from the base of the skull through the vertex without contrast. Multidetector CT imaging of the orbits was performed using the standard protocol without intravenous contrast.  COMPARISON:  None.  FINDINGS: CT HEAD FINDINGS  No skull fracture is noted. Minimal mucosal thickening posterior aspect  of the left maxillary sinus. The mastoid air cells are unremarkable.  No intracranial hemorrhage, mass effect or midline shift. No hydrocephalus. The gray and white-matter differentiation is preserved. No acute infarction. No mass lesion is noted on this unenhanced scan.  CT ORBITS FINDINGS  There is subtle mild soft tissue swelling right preorbital. No zygomatic fracture is noted. No paranasal sinuses air-fluid levels. Minimal mucosal thickening posterior aspect of the left maxillary sinus.  No nasal bone fracture is noted.  No intraorbital hematoma. Coronal images shows no orbital rim or orbital floor fracture. Bilateral eye globe is symmetrical in appearance. There is no TMJ dislocation. The nasal septum is midline. Bilateral semilunar canal is patent. The nasal turbinates are unremarkable. Sagittal images shows no maxillary spine fracture. The nasopharyngeal and oropharyngeal airway is patent.  IMPRESSION: 1. No acute intracranial abnormality. 2. No orbital rim or orbital floor fracture. Minimal soft tissue swelling right preorbital. No intraorbital hematoma. Bilateral eye globe is symmetrical in appearance. 3. Minimal mucosal thickening posterior aspect of the left maxillary sinus. No paranasal sinuses air-fluid levels. 4. Patent nasopharyngeal  and oropharyngeal airway. Unremarkable nasal turbinates.   Electronically Signed   By: Natasha MeadLiviu  Pop M.D.   On: 01/19/2015 13:19     MDM   1. Contusion of head, initial encounter   2. Injury   3. Traumatic iritis    rx for hydrocodone rx for ciloxan (Pt is going to see Opthomology, Pt advised to see Opthomology before filling rx    Elson AreasLeslie K Sofia, PA-C 01/19/15 1555

## 2015-01-19 NOTE — Discharge Instructions (Signed)
Head Injury °You have received a head injury. It does not appear serious at this time. Headaches and vomiting are common following head injury. It should be easy to awaken from sleeping. Sometimes it is necessary for you to stay in the emergency department for a while for observation. Sometimes admission to the hospital may be needed. After injuries such as yours, most problems occur within the first 24 hours, but side effects may occur up to 7-10 days after the injury. It is important for you to carefully monitor your condition and contact your health care provider or seek immediate medical care if there is a change in your condition. °WHAT ARE THE TYPES OF HEAD INJURIES? °Head injuries can be as minor as a bump. Some head injuries can be more severe. More severe head injuries include: °· A jarring injury to the brain (concussion). °· A bruise of the brain (contusion). This mean there is bleeding in the brain that can cause swelling. °· A cracked skull (skull fracture). °· Bleeding in the brain that collects, clots, and forms a bump (hematoma). °WHAT CAUSES A HEAD INJURY? °A serious head injury is most likely to happen to someone who is in a car wreck and is not wearing a seat belt. Other causes of major head injuries include bicycle or motorcycle accidents, sports injuries, and falls. °HOW ARE HEAD INJURIES DIAGNOSED? °A complete history of the event leading to the injury and your current symptoms will be helpful in diagnosing head injuries. Many times, pictures of the brain, such as CT or MRI are needed to see the extent of the injury. Often, an overnight hospital stay is necessary for observation.  °WHEN SHOULD I SEEK IMMEDIATE MEDICAL CARE?  °You should get help right away if: °· You have confusion or drowsiness. °· You feel sick to your stomach (nauseous) or have continued, forceful vomiting. °· You have dizziness or unsteadiness that is getting worse. °· You have severe, continued headaches not relieved by  medicine. Only take over-the-counter or prescription medicines for pain, fever, or discomfort as directed by your health care provider. °· You do not have normal function of the arms or legs or are unable to walk. °· You notice changes in the black spots in the center of the colored part of your eye (pupil). °· You have a clear or bloody fluid coming from your nose or ears. °· You have a loss of vision. °During the next 24 hours after the injury, you must stay with someone who can watch you for the warning signs. This person should contact local emergency services (911 in the [place] if you have seizures, you become unconscious, or you are unable to wake up. °HOW CAN I PREVENT A HEAD INJURY IN THE FUTURE? °The most important factor for preventing major head injuries is avoiding motor vehicle accidents.  To minimize the potential for damage to your head, it is crucial to wear seat belts while riding in motor vehicles. Wearing helmets while bike riding and playing collision sports (like football) is also helpful. Also, avoiding dangerous activities around the house will further help reduce your risk of head injury.  °WHEN CAN I RETURN TO NORMAL ACTIVITIES AND ATHLETICS? °You should be reevaluated by your health care provider before returning to these activities. If you have any of the following symptoms, you should not return to activities or contact sports until 1 week after the symptoms have stopped: °· Persistent headache. °· Dizziness or vertigo. °· Poor attention and concentration. °· Confusion. °·   Memory problems.  Nausea or vomiting.  Fatigue or tire easily.  Irritability.  Intolerant of bright lights or loud noises.  Anxiety or depression.  Disturbed sleep. MAKE SURE YOU:   Understand these instructions.  Will watch your condition.  Will get help right away if you are not doing well or get worse. Document Released: 11/06/2005 Document Revised: 11/11/2013 Document Reviewed:  07/14/2013 [facility] Patient Information 2015 [place], [place]. This information is not intended to replace advice given to you by your health care provider. Make sure you discuss any questions you have with your health care provider. Contusion A contusion is a deep bruise. Contusions are the result of an injury that caused bleeding under the skin. The contusion may turn blue, purple, or yellow. Minor injuries will give you a painless contusion, but more severe contusions may stay painful and swollen for a few weeks.  CAUSES  A contusion is usually caused by a blow, trauma, or direct force to an area of the body. SYMPTOMS   Swelling and redness of the injured area.  Bruising of the injured area.  Tenderness and soreness of the injured area.  Pain. DIAGNOSIS  The diagnosis can be made by taking a history and physical exam. An X-ray, CT scan, or MRI may be needed to determine if there were any associated injuries, such as fractures. TREATMENT  Specific treatment will depend on what area of the body was injured. In general, the best treatment for a contusion is resting, icing, elevating, and applying cold compresses to the injured area. Over-the-counter medicines may also be recommended for pain control. Ask your caregiver what the best treatment is for your contusion. HOME CARE INSTRUCTIONS   Put ice on the injured area.  Put ice in a plastic bag.  Place a towel between your skin and the bag.  Leave the ice on for 15-20 minutes, 3-4 times a day, or as directed by your health care provider.  Only take over-the-counter or prescription medicines for pain, discomfort, or fever as directed by your caregiver. Your caregiver may recommend avoiding anti-inflammatory medicines (aspirin, ibuprofen, and naproxen) for 48 hours because these medicines may increase bruising.  Rest the injured area.  If possible, elevate the injured area to reduce swelling. SEEK IMMEDIATE MEDICAL CARE IF:   You have  increased bruising or swelling.  You have pain that is getting worse.  Your swelling or pain is not relieved with medicines. MAKE SURE YOU:   Understand these instructions.  Will watch your condition.  Will get help right away if you are not doing well or get worse. Document Released: 08/16/2005 Document Revised: 11/11/2013 Document Reviewed: 09/11/2011 [facility] Patient Information 2015 [place], [place]. This information is not intended to replace advice given to you by your health care provider. Make sure you discuss any questions you have with your health care provider. Iritis Iritis is an inflammation of the colored part of the eye (iris). Other parts at the front of the eye may also be inflamed. The iris is part of the middle layer of the eyeball which is called the uvea or the uveal track. Any part of the uveal track can become inflamed. The other portions of the uveal track are the choroid (the thin membrane under the outer layer of the eye), and the ciliary body (joins the choroid and the iris and produces the fluid in the front of the eye).  It is extremely important to treat iritis early, as it may lead to internal eye damage  causing scarring or diseases such as glaucoma. Some people have only one attack of iritis (in one or both eyes) in their lifetime, while others may get it many times. CAUSES Iritis can be associated with many different diseases, but mostly occurs in otherwise healthy people. Examples of diseases that can be associated with iritis include:  Diseases where the body's immune system attacks tissues within your own body (autoimmune diseases).  Infections (tuberculosis, gonorrhea, fungus infections, Lyme disease, infection of the lining of the heart).  Trauma or injury.  Eye diseases (acute glaucoma and others).  Inflammation from other parts of the uveal track.  Severe eye infections.  Other rare diseases. SYMPTOMS  Eye pain or aching.  Sensitivity to  light.  Loss of sight or blurred vision.  Redness of the eye. This is often accompanied by a ring of redness around the outside of the cornea, or clear covering at the front of the eye (ciliary flush).  Excessive tearing of the eye(s).  A small pupil that does not enlarge in the dark and stays smaller than the other eye's pupil.  A whitish area that obscures the lower part of the colored circular iris. Sometimes this is visible when looking at the eye, where the whitish area has a "fluid level" or flat top. This is called a "hypopyon" and is actually pus inside the eye. Since iritis causes the eye to become red, it is often confused with a much less dangerous form of "pink eye" or conjunctivitis. One of the most important symptoms is sensitivity to light. Anytime there is redness, discomfort in the eye(s) and extreme light sensitivity, it is extremely important to see an ophthalmologist as soon as possible. TREATMENT Acute iritis requires prompt medical evaluation by an eye specialist (ophthalmologist.) Treatment depends on the underlying cause but may include:  Corticosteroid eye drops and dilating eye drops. Follow your caregiver's exact instructions on taking and stopping corticosteroid medications (drops or pills).  Occasionally, the iritis will be so severe that it will not respond to commonly used medications. If this happens, it may be necessary to use steroid injections. The injections are given under the eye's outer surface. Sometimes oral medications are given. The decision on treatment used for iritis is usually made on an individual basis. HOME CARE INSTRUCTIONS Your care giver will give specific instructions regarding the use of eye medications or other medications. Be certain to follow all instructions in both taking and stopping the medications. SEEK IMMEDIATE MEDICAL CARE IF:  You have redness of one or both eye.  You experience a great deal of light sensitivity.  You have  pain or aching in either eye. MAKE SURE YOU:   Understand these instructions.  Will watch your condition.  Will get help right away if you are not doing well or get worse. Document Released: 11/06/2005 Document Revised: 01/29/2012 Document Reviewed: 04/26/2007 [facility] Patient Information 2015 [place]. This information is not intended to replace advice given to you by your health care provider. Make sure you discuss any questions you have with your health care provider.

## 2015-01-19 NOTE — ED Notes (Signed)
Rt eye pain, redness x 3 days. Patient was hit over rt eye Sat morning with a football. Later that night she developed a headache, slight nausea, light sensitivity, rt eye and facial pain, eye redness.

## 2015-01-26 ENCOUNTER — Ambulatory Visit (INDEPENDENT_AMBULATORY_CARE_PROVIDER_SITE_OTHER): Payer: BLUE CROSS/BLUE SHIELD | Admitting: Physician Assistant

## 2015-01-26 ENCOUNTER — Encounter: Payer: Self-pay | Admitting: Physician Assistant

## 2015-01-26 VITALS — BP 110/72 | HR 76 | Temp 97.7°F | Resp 16 | Ht 69.0 in | Wt 140.0 lb

## 2015-01-26 DIAGNOSIS — Z0001 Encounter for general adult medical examination with abnormal findings: Secondary | ICD-10-CM

## 2015-01-26 DIAGNOSIS — R6889 Other general symptoms and signs: Secondary | ICD-10-CM

## 2015-01-26 DIAGNOSIS — M791 Myalgia, unspecified site: Secondary | ICD-10-CM

## 2015-01-26 DIAGNOSIS — T7840XD Allergy, unspecified, subsequent encounter: Secondary | ICD-10-CM

## 2015-01-26 DIAGNOSIS — G43809 Other migraine, not intractable, without status migrainosus: Secondary | ICD-10-CM

## 2015-01-26 DIAGNOSIS — K5900 Constipation, unspecified: Secondary | ICD-10-CM

## 2015-01-26 DIAGNOSIS — R350 Frequency of micturition: Secondary | ICD-10-CM

## 2015-01-26 DIAGNOSIS — E559 Vitamin D deficiency, unspecified: Secondary | ICD-10-CM

## 2015-01-26 DIAGNOSIS — E041 Nontoxic single thyroid nodule: Secondary | ICD-10-CM

## 2015-01-26 DIAGNOSIS — D649 Anemia, unspecified: Secondary | ICD-10-CM

## 2015-01-26 DIAGNOSIS — Z872 Personal history of diseases of the skin and subcutaneous tissue: Secondary | ICD-10-CM | POA: Insufficient documentation

## 2015-01-26 LAB — LIPID PANEL
Cholesterol: 183 mg/dL (ref 0–200)
HDL: 88 mg/dL (ref >=46)
LDL CALC: 87 mg/dL (ref 0–99)
Total CHOL/HDL Ratio: 2.1 Ratio
Triglycerides: 41 mg/dL (ref <150)
VLDL: 8 mg/dL (ref 0–40)

## 2015-01-26 LAB — CBC WITH DIFFERENTIAL/PLATELET
BASOS ABS: 0 10*3/uL (ref 0.0–0.1)
Basophils Relative: 1 % (ref 0–1)
EOS PCT: 1 % (ref 0–5)
Eosinophils Absolute: 0 10*3/uL (ref 0.0–0.7)
HCT: 37.5 % (ref 36.0–46.0)
Hemoglobin: 12.3 g/dL (ref 12.0–15.0)
Lymphocytes Relative: 55 % — ABNORMAL HIGH (ref 12–46)
Lymphs Abs: 2 10*3/uL (ref 0.7–4.0)
MCH: 30.8 pg (ref 26.0–34.0)
MCHC: 32.8 g/dL (ref 30.0–36.0)
MCV: 93.8 fL (ref 78.0–100.0)
MPV: 9.5 fL (ref 8.6–12.4)
Monocytes Absolute: 0.4 10*3/uL (ref 0.1–1.0)
Monocytes Relative: 10 % (ref 3–12)
NEUTROS ABS: 1.2 10*3/uL — AB (ref 1.7–7.7)
Neutrophils Relative %: 33 % — ABNORMAL LOW (ref 43–77)
PLATELETS: 285 10*3/uL (ref 150–400)
RBC: 4 MIL/uL (ref 3.87–5.11)
RDW: 13.7 % (ref 11.5–15.5)
WBC: 3.6 10*3/uL — AB (ref 4.0–10.5)

## 2015-01-26 LAB — BASIC METABOLIC PANEL WITH GFR
BUN: 10 mg/dL (ref 6–23)
CALCIUM: 9.5 mg/dL (ref 8.4–10.5)
CHLORIDE: 102 meq/L (ref 96–112)
CO2: 28 meq/L (ref 19–32)
CREATININE: 0.75 mg/dL (ref 0.50–1.10)
GLUCOSE: 81 mg/dL (ref 70–99)
POTASSIUM: 4 meq/L (ref 3.5–5.3)
Sodium: 137 mEq/L (ref 135–145)

## 2015-01-26 LAB — TSH: TSH: 0.541 u[IU]/mL (ref 0.350–4.500)

## 2015-01-26 LAB — HEPATIC FUNCTION PANEL
ALT: 11 U/L (ref 0–35)
AST: 17 U/L (ref 0–37)
Albumin: 4.4 g/dL (ref 3.5–5.2)
Alkaline Phosphatase: 69 U/L (ref 39–117)
Bilirubin, Direct: 0.1 mg/dL (ref 0.0–0.3)
Indirect Bilirubin: 0.5 mg/dL (ref 0.2–1.2)
TOTAL PROTEIN: 7.5 g/dL (ref 6.0–8.3)
Total Bilirubin: 0.6 mg/dL (ref 0.2–1.2)

## 2015-01-26 LAB — MAGNESIUM: MAGNESIUM: 1.9 mg/dL (ref 1.5–2.5)

## 2015-01-26 LAB — FERRITIN: Ferritin: 44 ng/mL (ref 10–291)

## 2015-01-26 LAB — IRON AND TIBC
%SAT: 36 % (ref 20–55)
IRON: 130 ug/dL (ref 42–145)
TIBC: 360 ug/dL (ref 250–470)
UIBC: 230 ug/dL (ref 125–400)

## 2015-01-26 NOTE — Patient Instructions (Addendum)
Add ENTERIC COATED low dose 81 mg Aspirin daily OR can do every other day if you have easy bruising to protect your heart and head.   Encourage you to get the 3D Mammogram  The 3D Mammogram is much more specific and sensitive to pick up breast cancer. For women with fibrocystic breast or lumpy breast it can be hard to determine if it is cancer or not but the 3D mammogram is able to tell this difference which cuts back on unneeded additional tests or scary call backs.   Benefiber is good for constipation/diarrhea/irritable bowel syndrome, it helps with weight loss and can help lower your bad cholesterol. Please do 1-2 TBSP in the morning in water, coffee, or tea. It can take up to a month before you can see a difference with your bowel movements. It is cheapest from [employer], [employer], [employer].   Concussion A concussion is a brain injury. It is caused by:  A hit to the head.  A quick and sudden movement (jolt) of the head or neck. A concussion is usually not life threatening. Even so, it can cause serious problems. If you had a concussion before, you may have concussion-like problems after a hit to your head. HOME CARE General Instructions  Follow your doctor's directions carefully.  Take medicines only as told by your doctor.  Only take medicines your doctor says are safe.  Do not drink alcohol until your doctor says it is okay. Alcohol and some drugs can slow down healing. They can also put you at risk for further injury.  If you are having trouble remembering things, write them down.  Try to do one thing at a time if you get distracted easily. For example, do not watch TV while making dinner.  Talk to your family members or close friends when making important decisions.  Follow up with your doctor as told.  Watch your symptoms. Tell others to do the same. Serious problems can sometimes happen after a concussion. Older adults are more likely to have these problems.  Tell your  teachers, school nurse, school counselor, coach, Event organiser, or work Production designer, theatre/television/film about your concussion. Tell them about what you can or cannot do. They should watch to see if:  It gets even harder for you to pay attention or concentrate.  It gets even harder for you to remember things or learn new things.  You need more time than normal to finish things.  You become annoyed (irritable) more than before.  You are not able to deal with stress as well.  You have more problems than before.  Rest. Make sure you:  Get plenty of sleep at night.  Go to sleep early.  Go to bed at the same time every day. Try to wake up at the same time.  Rest during the day.  Take naps when you feel tired.  Limit activities where you have to think a lot or concentrate. These include:  Doing homework.  Doing work related to a job.  Watching TV.  Using the computer. Returning To Your Regular Activities Return to your normal activities slowly, not all at once. You must give your body and brain enough time to heal.   Do not play sports or do other athletic activities until your doctor says it is okay.  Ask your doctor when you can drive, ride a bicycle, or work other vehicles or machines. Never do these things if you feel dizzy.  Ask your doctor about when you can  return to work or school. Preventing Another Concussion It is very important to avoid another brain injury, especially before you have healed. In rare cases, another injury can lead to permanent brain damage, brain swelling, or death. The risk of this is greatest during the first 7-10 days after your injury. Avoid injuries by:   Wearing a seat belt when riding in a car.  Not drinking too much alcohol.  Avoiding activities that could lead to a second concussion (such as contact sports).  Wearing a helmet when doing activities like:  Biking.  Skiing.  Skateboarding.  Skating.  Making your home safer by:  Removing things  from the floor or stairways that could make you trip.  Using grab bars in bathrooms and handrails by stairs.  Placing non-slip mats on floors and in bathtubs.  Improve lighting in dark areas. GET HELP IF:  It gets even harder for you to pay attention or concentrate.  It gets even harder for you to remember things or learn new things.  You need more time than normal to finish things.  You become annoyed (irritable) more than before.  You are not able to deal with stress as well.  You have more problems than before.  You have problems keeping your balance.  You are not able to react quickly when you should. Get help if you have any of these problems for more than 2 weeks:   Lasting (chronic) headaches.  Dizziness or trouble balancing.  Feeling sick to your stomach (nausea).  Seeing (vision) problems.  Being affected by noises or light more than normal.  Feeling sad, low, down in the dumps, blue, gloomy, or empty (depressed).  Mood changes (mood swings).  Feeling of fear or nervousness about what may happen (anxiety).  Feeling annoyed.  Memory problems.  Problems concentrating or paying attention.  Sleep problems.  Feeling tired all the time. GET HELP RIGHT AWAY IF:   You have bad headaches or your headaches get worse.  You have weakness (even if it is in one hand, leg, or part of the face).  You have loss of feeling (numbness).  You feel off balance.  You keep throwing up (vomiting).  You feel tired.  One black center of your eye (pupil) is larger than the other.  You twitch or shake violently (convulse).  Your speech is not clear (slurred).  You are more confused, easily angered (agitated), or annoyed than before.  You have more trouble resting than before.  You are unable to recognize people or places.  You have neck pain.  It is difficult to wake you up.  You have unusual behavior changes.  You pass out (lose consciousness). MAKE  SURE YOU:   Understand these instructions.  Will watch your condition.  Will get help right away if you are not doing well or get worse. Document Released: 10/25/2009 Document Revised: 03/23/2014 Document Reviewed: 05/29/2013 [facility] Patient Information 2015 [place], [place]. This information is not intended to replace advice given to you by your health care provider. Make sure you discuss any questions you have with your health care provider.
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
## 2015-01-27 LAB — URINALYSIS, ROUTINE W REFLEX MICROSCOPIC
Bilirubin Urine: NEGATIVE
Glucose, UA: NEGATIVE mg/dL
HGB URINE DIPSTICK: NEGATIVE
KETONES UR: NEGATIVE mg/dL
LEUKOCYTES UA: NEGATIVE
NITRITE: NEGATIVE
Protein, ur: NEGATIVE mg/dL
Specific Gravity, Urine: 1.018 (ref 1.005–1.030)
UROBILINOGEN UA: 0.2 mg/dL (ref 0.0–1.0)
pH: 7 (ref 5.0–8.0)

## 2015-01-27 LAB — VITAMIN D 25 HYDROXY (VIT D DEFICIENCY, FRACTURES): VIT D 25 HYDROXY: 8 ng/mL — AB (ref 30–100)

## 2015-01-28 LAB — URINE CULTURE
Colony Count: NO GROWTH
Organism ID, Bacteria: NO GROWTH

## 2015-02-23 DIAGNOSIS — E042 Nontoxic multinodular goiter: Secondary | ICD-10-CM | POA: Insufficient documentation

## 2015-04-30 ENCOUNTER — Other Ambulatory Visit: Payer: Self-pay | Admitting: Physician Assistant

## 2015-05-06 ENCOUNTER — Ambulatory Visit (INDEPENDENT_AMBULATORY_CARE_PROVIDER_SITE_OTHER): Payer: BLUE CROSS/BLUE SHIELD | Admitting: Physician Assistant

## 2015-05-06 ENCOUNTER — Encounter: Payer: Self-pay | Admitting: Physician Assistant

## 2015-05-06 VITALS — BP 110/70 | HR 80 | Temp 97.7°F | Resp 16 | Ht 69.5 in | Wt 137.0 lb

## 2015-05-06 DIAGNOSIS — G43809 Other migraine, not intractable, without status migrainosus: Secondary | ICD-10-CM

## 2015-05-06 DIAGNOSIS — M2660 Temporomandibular joint disorder, unspecified: Secondary | ICD-10-CM

## 2015-05-06 DIAGNOSIS — M26609 Unspecified temporomandibular joint disorder, unspecified side: Secondary | ICD-10-CM

## 2015-05-06 MED ORDER — SUMATRIPTAN SUCCINATE 100 MG PO TABS: 100.0000 mg | ORAL_TABLET | Freq: Once | ORAL | Status: DC | PRN

## 2015-05-06 MED ORDER — BACLOFEN 10 MG PO TABS: 10.0000 mg | ORAL_TABLET | Freq: Every evening | ORAL | Status: DC | PRN

## 2015-05-06 NOTE — Patient Instructions (Signed)
Add ENTERIC COATED low dose 81 mg Aspirin daily OR can do every other day if you have easy bruising to protect your heart and head. As well as to reduce risk of Colon Cancer by 20 %, Skin Cancer by 26 % , Melanoma by 46% and Pancreatic cancer by 60%  Migraine Headache A migraine headache is an intense, throbbing pain on one or both sides of your head. A migraine can last for 30 minutes to several hours. CAUSES  The exact cause of a migraine headache is not always known. However, a migraine may be caused when nerves in the brain become irritated and release chemicals that cause inflammation. This causes pain. Certain things may also trigger migraines, such as:  Alcohol.  Smoking.  Stress.  Menstruation.  Aged cheeses.  Foods or drinks that contain nitrates, glutamate, aspartame, or tyramine.  Lack of sleep.  Chocolate.  Caffeine.  Hunger.  Physical exertion.  Fatigue.  Medicines used to treat chest pain (nitroglycerine), birth control pills, estrogen, and some blood pressure medicines. SIGNS AND SYMPTOMS  Pain on one or both sides of your head.  Pulsating or throbbing pain.  Severe pain that prevents daily activities.  Pain that is aggravated by any physical activity.  Nausea, vomiting, or both.  Dizziness.  Pain with exposure to bright lights, loud noises, or activity.  General sensitivity to bright lights, loud noises, or smells. Before you get a migraine, you may get warning signs that a migraine is coming (aura). An aura may include:  Seeing flashing lights.  Seeing bright spots, halos, or zigzag lines.  Having tunnel vision or blurred vision.  Having feelings of numbness or tingling.  Having trouble talking.  Having muscle weakness. DIAGNOSIS  A migraine headache is often diagnosed based on:  Symptoms.  Physical exam.  A CT scan or MRI of your head. These imaging tests cannot diagnose migraines, but they can help rule out other causes of  headaches. TREATMENT Medicines may be given for pain and nausea. Medicines can also be given to help prevent recurrent migraines.  HOME CARE INSTRUCTIONS  Only take over-the-counter or prescription medicines for pain or discomfort as directed by your health care provider. The use of long-term narcotics is not recommended.  Lie down in a dark, quiet room when you have a migraine.  Keep a journal to find out what may trigger your migraine headaches. For example, write down:  What you eat and drink.  How much sleep you get.  Any change to your diet or medicines.  Limit alcohol consumption.  Quit smoking if you smoke.  Get 7-9 hours of sleep, or as recommended by your health care provider.  Limit stress.  Keep lights dim if bright lights bother you and make your migraines worse. SEEK IMMEDIATE MEDICAL CARE IF:   Your migraine becomes severe.  You have a fever.  You have a stiff neck.  You have vision loss.  You have muscular weakness or loss of muscle control.  You start losing your balance or have trouble walking.  You feel faint or pass out.  You have severe symptoms that are different from your first symptoms. MAKE SURE YOU:   Understand these instructions.  Will watch your condition.  Will get help right away if you are not doing well or get worse. Document Released: 11/06/2005 Document Revised: 03/23/2014 Document Reviewed: 07/14/2013 [facility] Patient Information 2015 [place], [place]. This information is not intended to replace advice given to you by your health care provider.  Make sure you discuss any questions you have with your health care provider.   What is the TMJ? The temporomandibular (tem-PUH-ro-man-DIB-yoo-ler) joint, or the TMJ, connects the upper and lower jawbones. This joint allows the jaw to open wide and move back and forth when you chew, talk, or yawn.There are also several muscles that help this joint move. There can be muscle tightness and  pain in the muscle that can cause several symptoms.  What causes TMJ pain? There are many causes of TMJ pain. Repeated chewing (for example, chewing gum) and clenching your teeth can cause pain in the joint. Some TMJ pain has no obvious cause. What can I do to ease the pain? There are many things you can do to help your pain get better. When you have pain:  Eat soft foods and stay away from chewy foods (for example, taffy) Try to use both sides of your mouth to chew Don't chew gum Massage Don't open your mouth wide (for example, during yawning or singing) Don't bite your cheeks or fingernails Lower your amount of stress and worry Applying a warm, damp washcloth to the joint may help. Over-the-counter pain medicines such as ibuprofen (one brand: Advil) or acetaminophen (one brand: Tylenol) might also help. Do not use these medicines if you are allergic to them or if your doctor told you not to use them. How can I stop the pain from coming back? When your pain is better, you can do these exercises to make your muscles stronger and to keep the pain from coming back:  Resisted mouth opening: Place your thumb or two fingers under your chin and open your mouth slowly, pushing up lightly on your chin with your thumb. Hold for three to six seconds. Close your mouth slowly. Resisted mouth closing: Place your thumbs under your chin and your two index fingers on the ridge between your mouth and the bottom of your chin. Push down lightly on your chin as you close your mouth. Tongue up: Slowly open and close your mouth while keeping the tongue touching the roof of the mouth. Side-to-side jaw movement: Place an object about one fourth of an inch thick (for example, two tongue depressors) between your front teeth. Slowly move your jaw from side to side. Increase the thickness of the object as the exercise becomes easier Forward jaw movement: Place an object about one fourth of an inch thick between your front  teeth and move the bottom jaw forward so that the bottom teeth are in front of the top teeth. Increase the thickness of the object as the exercise becomes easier. These exercises should not be painful. If it hurts to do these exercises, stop doing them and talk to your family doctor.  I think it is possible that you have sleep apnea. It can cause interrupted sleep, headaches, frequent awakenings, fatigue, dry mouth, fast/slow heart beats, memory issues, anxiety/depression, swelling, numbness tingling hands/feet, weight gain, shortness of breath, and the list goes on. Sleep apnea needs to be ruled out because if it is left untreated it does eventually lead to abnormal heart beats, lung failure or heart failure as well as increasing the risk of heart attack and stroke. There are masks you can wear OR a mouth piece that I can give you information about. Often times though people feel MUCH better after getting treatment.   Sleep Apnea  Sleep apnea is a sleep disorder characterized by abnormal pauses in breathing while you sleep. When your breathing pauses, the  level of oxygen in your blood decreases. This causes you to move out of deep sleep and into light sleep. As a result, your quality of sleep is poor, and the system that carries your blood throughout your body (cardiovascular system) experiences stress. If sleep apnea remains untreated, the following conditions can develop:  High blood pressure (hypertension).  Coronary artery disease.  Inability to achieve or maintain an erection (impotence).  Impairment of your thought process (cognitive dysfunction). There are three types of sleep apnea: 1. Obstructive sleep apnea--Pauses in breathing during sleep because of a blocked airway. 2. Central sleep apnea--Pauses in breathing during sleep because the area of the brain that controls your breathing does not send the correct signals to the muscles that control breathing. 3. Mixed sleep apnea--A  combination of both obstructive and central sleep apnea.  RISK FACTORS The following risk factors can increase your risk of developing sleep apnea:  Being overweight.  Smoking.  Having narrow passages in your nose and throat.  Being of older age.  Being female.  Alcohol use.  Sedative and tranquilizer use.  Ethnicity. Among individuals younger than 35 years, African Americans are at increased risk of sleep apnea. SYMPTOMS   Difficulty staying asleep.  Daytime sleepiness and fatigue.  Loss of energy.  Irritability.  Loud, heavy snoring.  Morning headaches.  Trouble concentrating.  Forgetfulness.  Decreased interest in sex. DIAGNOSIS  In order to diagnose sleep apnea, your caregiver will perform a physical examination. Your caregiver may suggest that you take a home sleep test. Your caregiver may also recommend that you spend the night in a sleep lab. In the sleep lab, several monitors record information about your heart, lungs, and brain while you sleep. Your leg and arm movements and blood oxygen level are also recorded. TREATMENT The following actions may help to resolve mild sleep apnea:  Sleeping on your side.   Using a decongestant if you have nasal congestion.   Avoiding the use of depressants, including alcohol, sedatives, and narcotics.   Losing weight and modifying your diet if you are overweight. There also are devices and treatments to help open your airway:  Oral appliances. These are custom-made mouthpieces that shift your lower jaw forward and slightly open your bite. This opens your airway.  Devices that create positive airway pressure. This positive pressure "splints" your airway open to help you breathe better during sleep. The following devices create positive airway pressure:  Continuous positive airway pressure (CPAP) device. The CPAP device creates a continuous level of air pressure with an air pump. The air is delivered to your airway  through a mask while you sleep. This continuous pressure keeps your airway open.  Nasal expiratory positive airway pressure (EPAP) device. The EPAP device creates positive air pressure as you exhale. The device consists of single-use valves, which are inserted into each nostril and held in place by adhesive. The valves create very little resistance when you inhale but create much more resistance when you exhale. That increased resistance creates the positive airway pressure. This positive pressure while you exhale keeps your airway open, making it easier to breath when you inhale again.  Bilevel positive airway pressure (BPAP) device. The BPAP device is used mainly in patients with central sleep apnea. This device is similar to the CPAP device because it also uses an air pump to deliver continuous air pressure through a mask. However, with the BPAP machine, the pressure is set at two different levels. The pressure when you exhale  is lower than the pressure when you inhale.  Surgery. Typically, surgery is only done if you cannot comply with less invasive treatments or if the less invasive treatments do not improve your condition. Surgery involves removing excess tissue in your airway to create a wider passage way. Document Released: 10/27/2002 Document Revised: 03/03/2013 Document Reviewed: 03/14/2012 [facility] Patient Information 2015 [place], [place]. This information is not intended to replace advice given to you by your health care provider. Make sure you discuss any questions you have with your health care provider.

## 2015-05-06 NOTE — Progress Notes (Signed)
Subjective:    Patient ID: Margaret Bartlett, female    DOB: 15-May-1961, 54 y.o.   MRN: [id number]  HPI 54 y.o. AAF with history migraines with aura and will have neuro issues during migraine presents for migraine. Has had 3 migraines this week, will last 30 mins and will sleep afterwards. Can happen anytime, studying/eating dinner, starts with aura with one eye and headache on the other side and she will have her vision get blurry in one eye for several mins. Will have dizziness, nausea occ. Denies any triggers other than more stress. She will take excedrin migraine x 2 without help. Had normal CT head 01/2015. Has had more sinus congestion x 2 weeks.   Blood pressure 110/70, pulse 80, temperature 97.7 F (36.5 C), resp. rate 16, height 5' 9.5" (1.765 m), weight 137 lb (62.143 kg).  Current Outpatient Prescriptions on File Prior to Visit  Medication Sig Dispense Refill  . Bepotastine Besilate 1.5 % SOLN 1-2 drops in the affected eye twice a day as needed 5 mL 3  . ciprofloxacin (CILOXAN) 0.3 % ophthalmic solution Place 2 drops into the right eye every 2 (two) hours while awake. Administer 1 drop, every 2 hours, while awake, for 2 days. Then 1 drop, every 4 hours, while awake, for the next 5 days. 5 mL 0  . estradiol (VIVELLE-DOT) 0.1 MG/24HR patch Place 1 patch onto the skin 2 (two) times a week.    Marland Kitchen HYDROcodone-acetaminophen (NORCO/VICODIN) 5-325 MG per tablet Take 2 tablets by mouth every 4 (four) hours as needed. 10 tablet 0  . levocetirizine (XYZAL) 5 MG tablet Take 1 tablet (5 mg total) by mouth every evening. 90 tablet 0  . levofloxacin (LEVAQUIN) 500 MG tablet Take 1 tablet (500 mg total) by mouth daily. 10 tablet 0  . NASONEX 50 MCG/ACT nasal spray PLACE 2 SPRAYS INTO THE NOSE DAILY. 17 g 2   No current facility-administered medications on file prior to visit.   Past Medical History  Diagnosis Date  . Allergy   . Anemia   . Migraine   . Vitamin D deficiency    Review of Systems   Constitutional: Negative.  Negative for fever, chills and fatigue.  HENT: Positive for congestion, ear pain and rhinorrhea. Negative for dental problem, drooling, ear discharge, facial swelling, hearing loss, mouth sores, nosebleeds, postnasal drip, sinus pressure, sneezing, sore throat, tinnitus, trouble swallowing and voice change.   Eyes: Positive for visual disturbance. Negative for photophobia, pain, discharge, redness and itching.  Respiratory: Negative.   Cardiovascular: Negative.   Gastrointestinal: Negative.   Genitourinary: Negative.   Neurological: Positive for dizziness and headaches. Negative for tremors, seizures, syncope, facial asymmetry, speech difficulty, weakness, light-headedness and numbness.       Objective:   Physical Exam  Constitutional: She is oriented to person, place, and time. She appears well-developed and well-nourished.  HENT:  Head: Normocephalic and atraumatic.  Right Ear: Hearing and external ear normal. No mastoid tenderness. Tympanic membrane is not injected, not erythematous, not retracted and not bulging. A middle ear effusion is present.  Left Ear: Hearing and external ear normal. No mastoid tenderness. Tympanic membrane is not injected, not erythematous, not retracted and not bulging. A middle ear effusion is present.  + TMJ right  Eyes: Conjunctivae are normal. Pupils are equal, round, and reactive to light.  Neck: Normal range of motion. Neck supple.  Cardiovascular: Normal rate.   Pulmonary/Chest: Effort normal and breath sounds normal.  Musculoskeletal: Normal range of  motion.  Neurological: She is alert and oriented to person, place, and time.  Skin: Skin is warm and dry.       Assessment & Plan:  Migraines- normal neuro, normal CT will get MRI if any changes ? Trigger stress/TMJ Heat massage soft foods, will prescribe imitrex to take PRN.  Add bASA If any changes call the office.

## 2015-05-28 IMAGING — CT CT HEAD W/O CM
3 of 7 series · 16 of 47 positions shown, 19 images · non-contrast
Comparison: None.

CLINICAL DATA: Football injury 3 days ago, hit right face with
football 3 days ago, right eye pain and redness

EXAM:
CT HEAD AND ORBITS WITHOUT CONTRAST
TECHNIQUE: Contiguous axial images were obtained from the base of the skull
through the vertex without contrast. Multidetector CT imaging of the
orbits was performed using the standard protocol without intravenous
contrast.

[Series 3: recon 2: brain wo · axial · 0.49mm/px · z∈[-123,+15]mm · 11 of 64 slices shown, 14 images]
[im 5/64  brain]
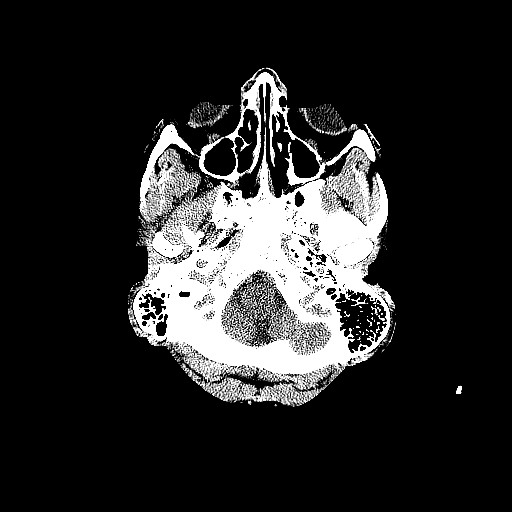
[im 5/64  bone]
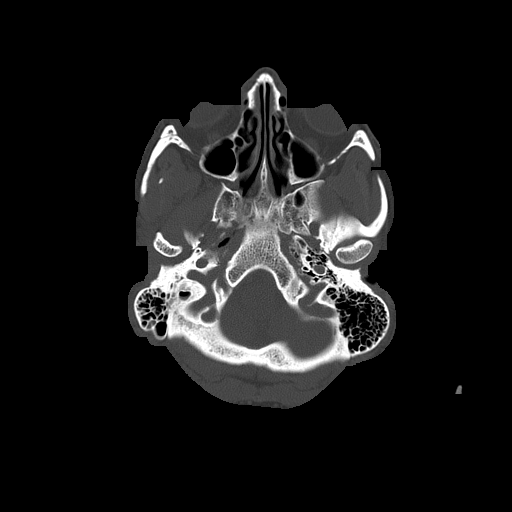
[im 10/64  brain]
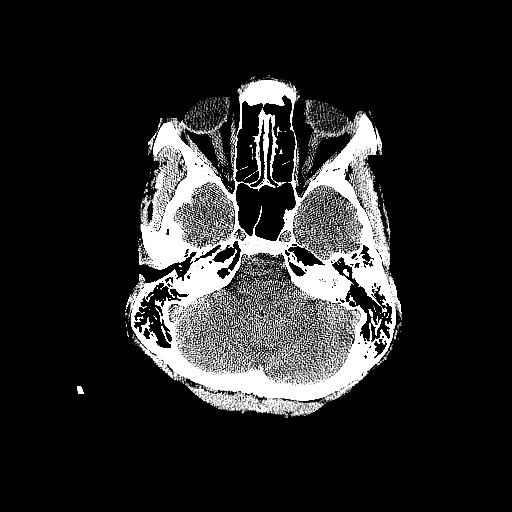
[im 15/64  brain]
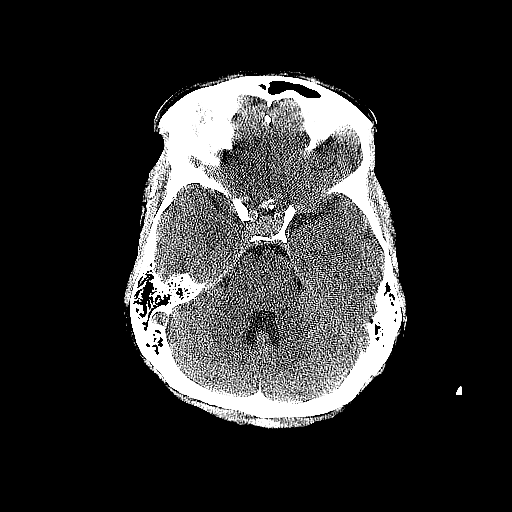
[im 20/64  brain]
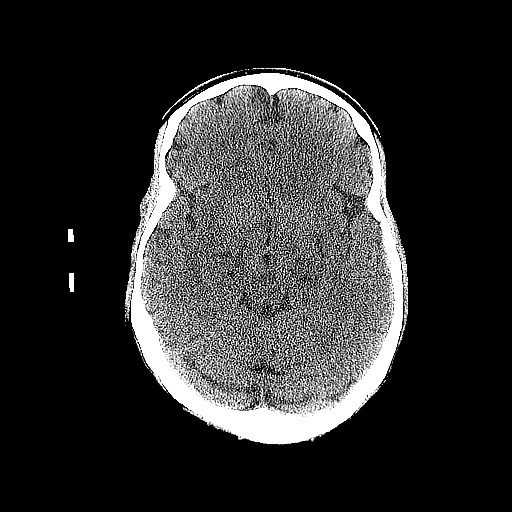
[im 25/64  brain]
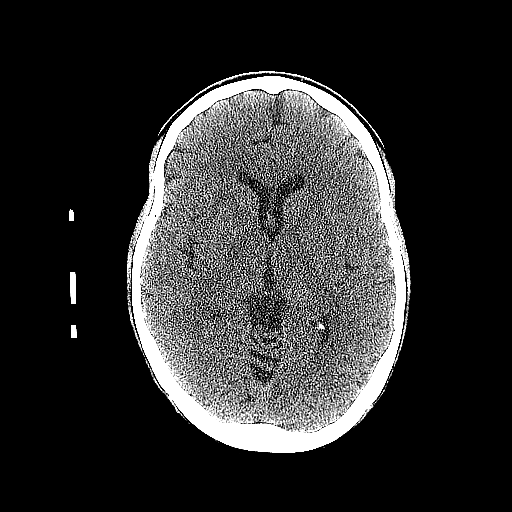
[im 25/64  bone]
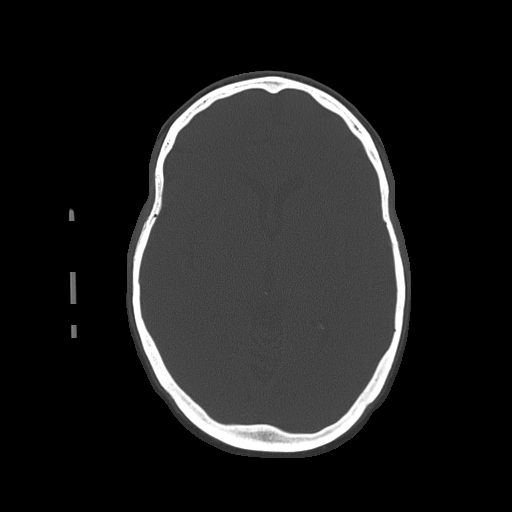
[im 34/64  brain]
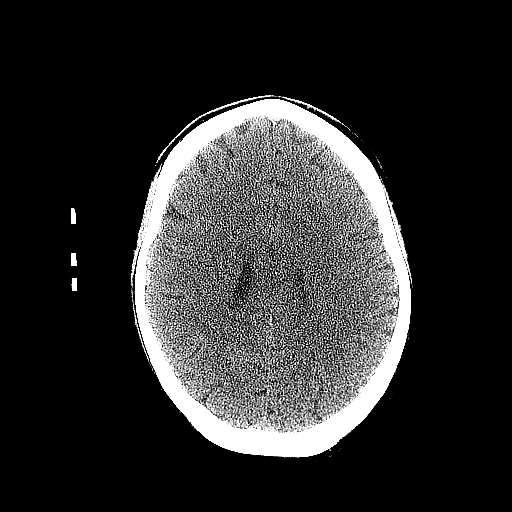
[im 39/64  brain]
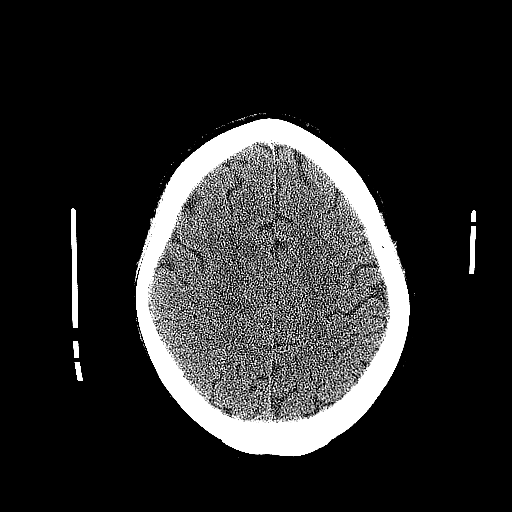
[im 44/64  brain]
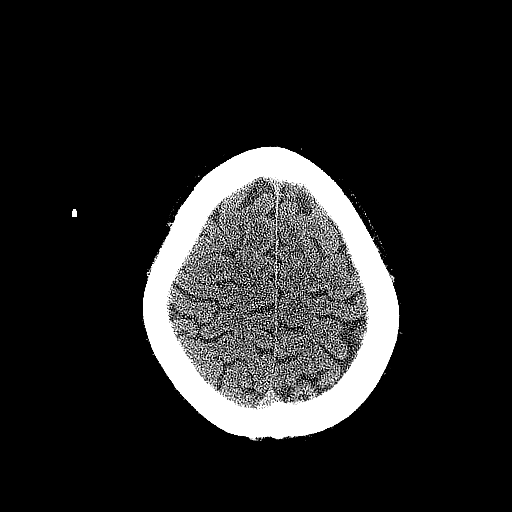
[im 49/64  brain]
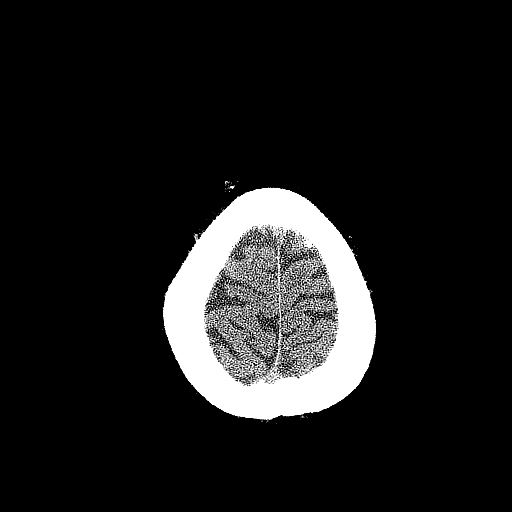
[im 49/64  bone]
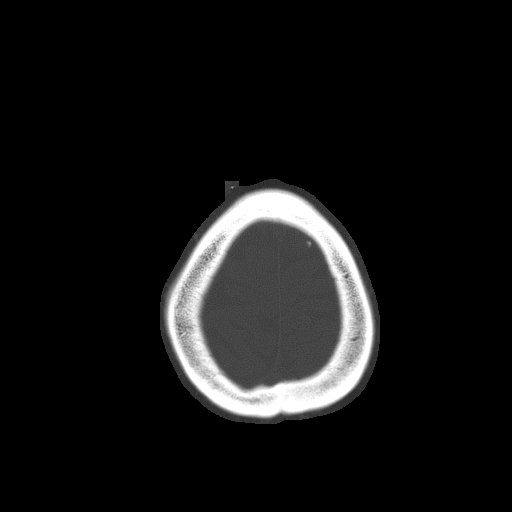
[im 54/64  brain]
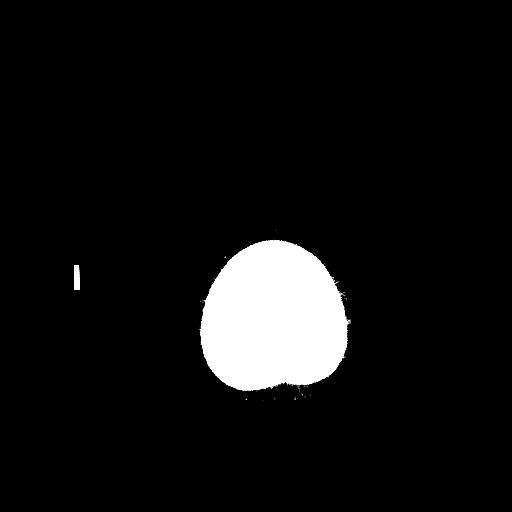
[im 59/64  brain]
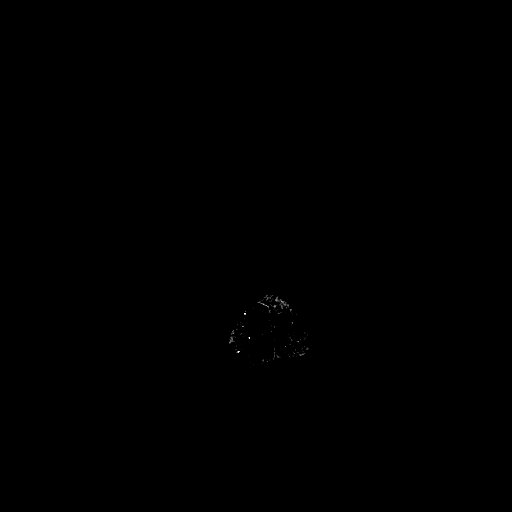

[Series 601: sagst · sagittal · 0.34mm/px · 3 of 70 slices shown]
[im 18/70  brain]
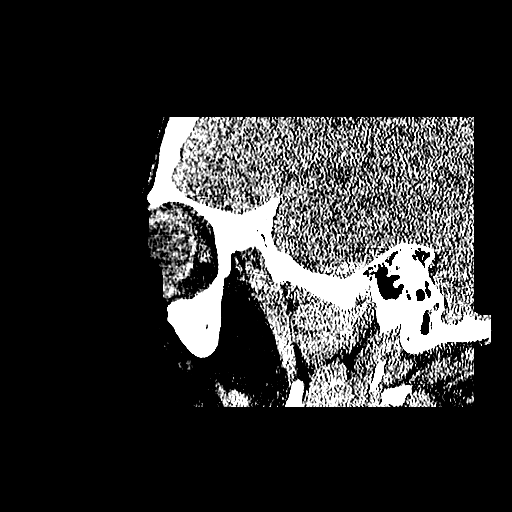
[im 35/70  brain]
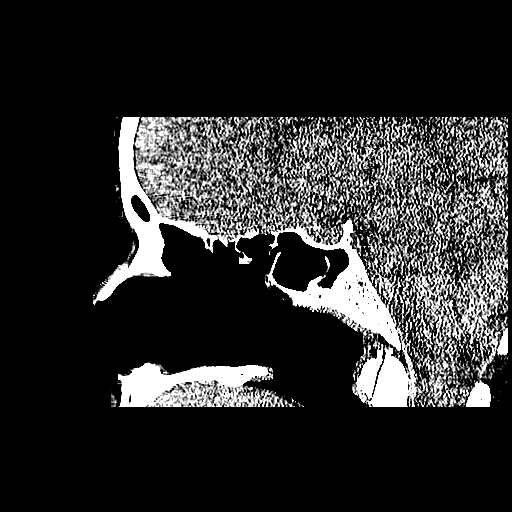
[im 52/70  brain]
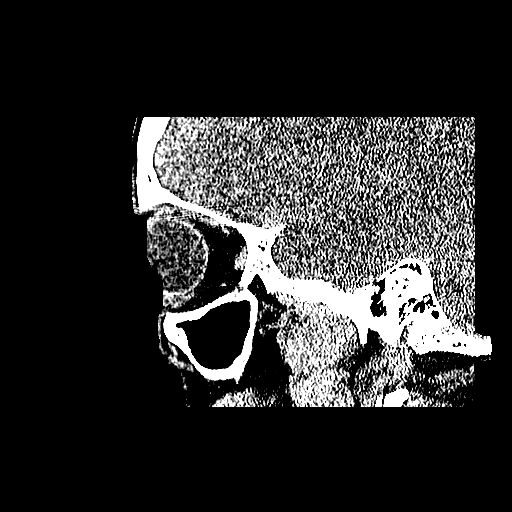

[Series 602: corbone · coronal · 0.34mm/px · 2 of 73 slices shown]
[im 25/73  brain]
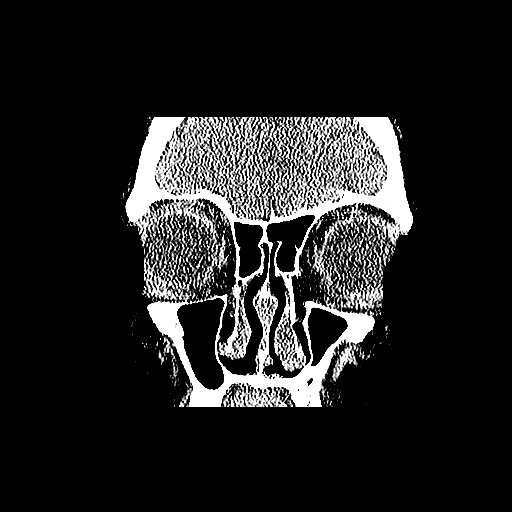
[im 49/73  brain]
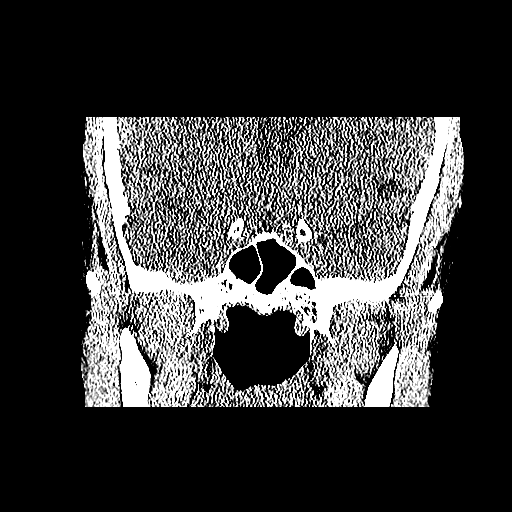

[16 of 47 positions shown; findings below may reference images not displayed]

FINDINGS: CT HEAD FINDINGS

No skull fracture is noted. Minimal mucosal thickening posterior
aspect of the left maxillary sinus. The mastoid air cells are
unremarkable.

No intracranial hemorrhage, mass effect or midline shift. No
hydrocephalus. The gray and white-matter differentiation is
preserved. No acute infarction. No mass lesion is noted on this
unenhanced scan.

CT ORBITS FINDINGS

There is subtle mild soft tissue swelling right preorbital. No
zygomatic fracture is noted. No paranasal sinuses air-fluid levels.
Minimal mucosal thickening posterior aspect of the left maxillary
sinus.

No nasal bone fracture is noted.

No intraorbital hematoma. Coronal images shows no orbital rim or
orbital floor fracture. Bilateral eye globe is symmetrical in
appearance. There is no TMJ dislocation. The nasal septum is
midline. Bilateral semilunar canal is patent. The nasal turbinates
are unremarkable. Sagittal images shows no maxillary spine fracture.
The nasopharyngeal and oropharyngeal airway is patent.
IMPRESSION: 1. No acute intracranial abnormality.
2. No orbital rim or orbital floor fracture. Minimal soft tissue
swelling right preorbital. No intraorbital hematoma. Bilateral eye
globe is symmetrical in appearance.
3. Minimal mucosal thickening posterior aspect of the left maxillary
sinus. No paranasal sinuses air-fluid levels.
4. Patent nasopharyngeal and oropharyngeal airway. Unremarkable
nasal turbinates.

## 2015-10-10 ENCOUNTER — Encounter: Payer: Self-pay | Admitting: *Deleted

## 2016-02-01 ENCOUNTER — Other Ambulatory Visit: Payer: Self-pay

## 2016-02-01 ENCOUNTER — Ambulatory Visit (INDEPENDENT_AMBULATORY_CARE_PROVIDER_SITE_OTHER): Payer: BLUE CROSS/BLUE SHIELD | Admitting: Physician Assistant

## 2016-02-01 ENCOUNTER — Encounter: Payer: Self-pay | Admitting: Physician Assistant

## 2016-02-01 VITALS — BP 120/60 | HR 67 | Temp 97.9°F | Resp 16 | Ht 70.0 in | Wt 134.4 lb

## 2016-02-01 DIAGNOSIS — Z0001 Encounter for general adult medical examination with abnormal findings: Secondary | ICD-10-CM

## 2016-02-01 DIAGNOSIS — D649 Anemia, unspecified: Secondary | ICD-10-CM | POA: Diagnosis not present

## 2016-02-01 DIAGNOSIS — Z79899 Other long term (current) drug therapy: Secondary | ICD-10-CM | POA: Diagnosis not present

## 2016-02-01 DIAGNOSIS — I10 Essential (primary) hypertension: Secondary | ICD-10-CM | POA: Diagnosis not present

## 2016-02-01 DIAGNOSIS — G43809 Other migraine, not intractable, without status migrainosus: Secondary | ICD-10-CM

## 2016-02-01 DIAGNOSIS — E042 Nontoxic multinodular goiter: Secondary | ICD-10-CM

## 2016-02-01 DIAGNOSIS — R6889 Other general symptoms and signs: Secondary | ICD-10-CM | POA: Diagnosis not present

## 2016-02-01 DIAGNOSIS — E041 Nontoxic single thyroid nodule: Secondary | ICD-10-CM

## 2016-02-01 DIAGNOSIS — Z1389 Encounter for screening for other disorder: Secondary | ICD-10-CM

## 2016-02-01 DIAGNOSIS — T7840XD Allergy, unspecified, subsequent encounter: Secondary | ICD-10-CM

## 2016-02-01 DIAGNOSIS — E559 Vitamin D deficiency, unspecified: Secondary | ICD-10-CM | POA: Diagnosis not present

## 2016-02-01 DIAGNOSIS — K5900 Constipation, unspecified: Secondary | ICD-10-CM

## 2016-02-01 DIAGNOSIS — Z0000 Encounter for general adult medical examination without abnormal findings: Secondary | ICD-10-CM

## 2016-02-01 DIAGNOSIS — Z13220 Encounter for screening for lipoid disorders: Secondary | ICD-10-CM

## 2016-02-01 LAB — CBC WITH DIFFERENTIAL/PLATELET
BASOS PCT: 1 % (ref 0–1)
Basophils Absolute: 0 10*3/uL (ref 0.0–0.1)
Eosinophils Absolute: 0 10*3/uL (ref 0.0–0.7)
Eosinophils Relative: 1 % (ref 0–5)
HEMATOCRIT: 37.7 % (ref 36.0–46.0)
HEMOGLOBIN: 12.7 g/dL (ref 12.0–15.0)
LYMPHS PCT: 45 % (ref 12–46)
Lymphs Abs: 1.7 10*3/uL (ref 0.7–4.0)
MCH: 31 pg (ref 26.0–34.0)
MCHC: 33.7 g/dL (ref 30.0–36.0)
MCV: 92 fL (ref 78.0–100.0)
MONO ABS: 0.3 10*3/uL (ref 0.1–1.0)
MPV: 9.8 fL (ref 8.6–12.4)
Monocytes Relative: 9 % (ref 3–12)
NEUTROS ABS: 1.7 10*3/uL (ref 1.7–7.7)
NEUTROS PCT: 44 % (ref 43–77)
Platelets: 282 10*3/uL (ref 150–400)
RBC: 4.1 MIL/uL (ref 3.87–5.11)
RDW: 13.3 % (ref 11.5–15.5)
WBC: 3.8 10*3/uL — AB (ref 4.0–10.5)

## 2016-02-01 LAB — IRON AND TIBC
%SAT: 34 % (ref 11–50)
Iron: 120 ug/dL (ref 45–160)
TIBC: 349 ug/dL (ref 250–450)
UIBC: 229 ug/dL (ref 125–400)

## 2016-02-01 LAB — FERRITIN: Ferritin: 42 ng/mL (ref 10–232)

## 2016-02-01 LAB — BASIC METABOLIC PANEL WITH GFR
BUN: 13 mg/dL (ref 7–25)
CHLORIDE: 104 mmol/L (ref 98–110)
CO2: 29 mmol/L (ref 20–31)
Calcium: 10 mg/dL (ref 8.6–10.4)
Creat: 0.69 mg/dL (ref 0.50–1.05)
GFR, Est African American: >89 mL/min (ref >=60)
GLUCOSE: 85 mg/dL (ref 65–99)
POTASSIUM: 4.4 mmol/L (ref 3.5–5.3)
SODIUM: 140 mmol/L (ref 135–146)

## 2016-02-01 LAB — HEPATIC FUNCTION PANEL
ALK PHOS: 68 U/L (ref 33–130)
ALT: 11 U/L (ref 6–29)
AST: 17 U/L (ref 10–35)
Albumin: 4.3 g/dL (ref 3.6–5.1)
BILIRUBIN DIRECT: 0.1 mg/dL (ref <=0.2)
BILIRUBIN INDIRECT: 0.3 mg/dL (ref 0.2–1.2)
TOTAL PROTEIN: 7.7 g/dL (ref 6.1–8.1)
Total Bilirubin: 0.4 mg/dL (ref 0.2–1.2)

## 2016-02-01 LAB — MAGNESIUM: MAGNESIUM: 2 mg/dL (ref 1.5–2.5)

## 2016-02-01 LAB — TSH: TSH: 0.7 mIU/L

## 2016-02-01 LAB — LIPID PANEL
CHOL/HDL RATIO: 2 ratio (ref <=5.0)
Cholesterol: 183 mg/dL (ref 125–200)
HDL: 91 mg/dL (ref >=46)
LDL Cholesterol: 80 mg/dL (ref <130)
Triglycerides: 58 mg/dL (ref <150)
VLDL: 12 mg/dL (ref <30)

## 2016-02-01 LAB — VITAMIN B12: Vitamin B-12: 916 pg/mL (ref 200–1100)

## 2016-02-01 MED ORDER — LEVOCETIRIZINE DIHYDROCHLORIDE 5 MG PO TABS: 5.0000 mg | ORAL_TABLET | Freq: Every evening | ORAL | Status: DC

## 2016-02-01 MED ORDER — BACLOFEN 10 MG PO TABS: 10.0000 mg | ORAL_TABLET | Freq: Every evening | ORAL | Status: AC | PRN

## 2016-02-01 NOTE — Patient Instructions (Addendum)
Add ENTERIC COATED low dose 81 mg Aspirin daily OR can do every other day if you have easy bruising to protect your heart and head. As well as to reduce risk of Colon Cancer by 20 %, Skin Cancer by 26 % , Melanoma by 46% and Pancreatic cancer by 60%  Flonase can get [facility] generic at [employer] for very cheap, check there first.   Benefiber is good for constipation/diarrhea/irritable bowel syndrome, it helps with weight loss and can help lower your bad cholesterol. Please do 1-2 TBSP in the morning in water, coffee, or tea. It can take up to a month before you can see a difference with your bowel movements. It is cheapest from [employer], [employer], [employer].   Preventive Care for Adults  A healthy lifestyle and preventive care can promote health and wellness. Preventive health guidelines for women include the following key practices.  A routine yearly physical is a good way to check with your health care provider about your health and preventive screening. It is a chance to share any concerns and updates on your health and to receive a thorough exam.  Visit your dentist for a routine exam and preventive care every 6 months. Brush your teeth twice a day and floss once a day. Good oral hygiene prevents tooth decay and gum disease.  The frequency of eye exams is based on your age, health, family medical history, use of contact lenses, and other factors. Follow your health care provider's recommendations for frequency of eye exams.  Eat a healthy diet. Foods like vegetables, fruits, whole grains, low-fat dairy products, and lean protein foods contain the nutrients you need without too many calories. Decrease your intake of foods high in solid fats, added sugars, and salt. Eat the right amount of calories for you.Get information about a proper diet from your health care provider, if necessary.  Regular physical exercise is one of the most important things you can do for your health. Most adults should get at  least 150 minutes of moderate-intensity exercise (any activity that increases your heart rate and causes you to sweat) each week. In addition, most adults need muscle-strengthening exercises on 2 or more days a week.  Maintain a healthy weight. The body mass index (BMI) is a screening tool to identify possible weight problems. It provides an estimate of body fat based on height and weight. Your health care provider can find your BMI and can help you achieve or maintain a healthy weight.For adults 20 years and older:  A BMI below 18.5 is considered underweight.  A BMI of 18.5 to 24.9 is normal.  A BMI of 25 to 29.9 is considered overweight.  A BMI of 30 and above is considered obese.  Maintain normal blood lipids and cholesterol levels by exercising and minimizing your intake of saturated fat. Eat a balanced diet with plenty of fruit and vegetables. Blood tests for lipids and cholesterol should begin at age 52 and be repeated every 5 years. If your lipid or cholesterol levels are high, you are over 50, or you are at high risk for heart disease, you may need your cholesterol levels checked more frequently.Ongoing high lipid and cholesterol levels should be treated with medicines if diet and exercise are not working.  If you smoke, find out from your health care provider how to quit. If you do not use tobacco, do not start.  Lung cancer screening is recommended for adults aged 74-80 years who are at high risk for developing lung  cancer because of a history of smoking. A yearly low-dose CT scan of the lungs is recommended for people who have at least a 30-pack-year history of smoking and are a current smoker or have quit within the past 15 years. A pack year of smoking is smoking an average of 1 pack of cigarettes a day for 1 year (for example: 1 pack a day for 30 years or 2 packs a day for 15 years). Yearly screening should continue until the smoker has stopped smoking for at least 15 years. Yearly  screening should be stopped for people who develop a health problem that would prevent them from having lung cancer treatment.  High blood pressure causes heart disease and increases the risk of stroke. Your blood pressure should be checked at least every 1 to 2 years. Ongoing high blood pressure should be treated with medicines if weight loss and exercise do not work.  If you are 9-59 years old, ask your health care provider if you should take aspirin to prevent strokes.  Diabetes screening involves taking a blood sample to check your fasting blood sugar level. This should be done once every 3 years, after age 58, if you are within normal weight and without risk factors for diabetes. Testing should be considered at a younger age or be carried out more frequently if you are overweight and have at least 1 risk factor for diabetes.  Breast cancer screening is essential preventive care for women. You should practice "breast self-awareness." This means understanding the normal appearance and feel of your breasts and may include breast self-examination. Any changes detected, no matter how small, should be reported to a health care provider. Women in their 54s and 30s should have a clinical breast exam (CBE) by a health care provider as part of a regular health exam every 1 to 3 years. After age 35, women should have a CBE every year. Starting at age 21, women should consider having a mammogram (breast X-ray test) every year. Women who have a family history of breast cancer should talk to their health care provider about genetic screening. Women at a high risk of breast cancer should talk to their health care providers about having an MRI and a mammogram every year.  Breast cancer gene (BRCA)-related cancer risk assessment is recommended for women who have family members with BRCA-related cancers. BRCA-related cancers include breast, ovarian, tubal, and peritoneal cancers. Having family members with these  cancers may be associated with an increased risk for harmful changes (mutations) in the breast cancer genes BRCA1 and BRCA2. Results of the assessment will determine the need for genetic counseling and BRCA1 and BRCA2 testing.  Routine pelvic exams to screen for cancer are no longer recommended for nonpregnant women who are considered low risk for cancer of the pelvic organs (ovaries, uterus, and vagina) and who do not have symptoms. Ask your health care provider if a screening pelvic exam is right for you.  If you have had past treatment for cervical cancer or a condition that could lead to cancer, you need Pap tests and screening for cancer for at least 20 years after your treatment. If Pap tests have been discontinued, your risk factors (such as having a new sexual partner) need to be reassessed to determine if screening should be resumed. Some women have medical problems that increase the chance of getting cervical cancer. In these cases, your health care provider may recommend more frequent screening and Pap tests.  Colorectal cancer can be  detected and often prevented. Most routine colorectal cancer screening begins at the age of 53 years and continues through age 37 years. However, your health care provider may recommend screening at an earlier age if you have risk factors for colon cancer. On a yearly basis, your health care provider may provide home test kits to check for hidden blood in the stool. Use of a small camera at the end of a tube, to directly examine the colon (sigmoidoscopy or colonoscopy), can detect the earliest forms of colorectal cancer. Talk to your health care provider about this at age 34, when routine screening begins. Direct exam of the colon should be repeated every 5-10 years through age 62 years, unless early forms of pre-cancerous polyps or small growths are found.  Hepatitis C blood testing is recommended for all people born from [birth year] through 1965 and any individual with  known risks for hepatitis C.  Pra  Osteoporosis is a disease in which the bones lose minerals and strength with aging. This can result in serious bone fractures or breaks. The risk of osteoporosis can be identified using a bone density scan. Women ages 16 years and over and women at risk for fractures or osteoporosis should discuss screening with their health care providers. Ask your health care provider whether you should take a calcium supplement or vitamin D to reduce the rate of osteoporosis.  Menopause can be associated with physical symptoms and risks. Hormone replacement therapy is available to decrease symptoms and risks. You should talk to your health care provider about whether hormone replacement therapy is right for you.  Use sunscreen. Apply sunscreen liberally and repeatedly throughout the day. You should seek shade when your shadow is shorter than you. Protect yourself by wearing long sleeves, pants, a wide-brimmed hat, and sunglasses year round, whenever you are outdoors.  Once a month, do a whole body skin exam, using a mirror to look at the skin on your back. Tell your health care provider of new moles, moles that have irregular borders, moles that are larger than a pencil eraser, or moles that have changed in shape or color.  Stay current with required vaccines (immunizations).  Influenza vaccine. All adults should be immunized every year.  Tetanus, diphtheria, and acellular pertussis (Td, Tdap) vaccine. Pregnant women should receive 1 dose of Tdap vaccine during each pregnancy. The dose should be obtained regardless of the length of time since the last dose. Immunization is preferred during the 27th-36th week of gestation. An adult who has not previously received Tdap or who does not know her vaccine status should receive 1 dose of Tdap. This initial dose should be followed by tetanus and diphtheria toxoids (Td) booster doses every 10 years. Adults with an unknown or incomplete  history of completing a 3-dose immunization series with Td-containing vaccines should begin or complete a primary immunization series including a Tdap dose. Adults should receive a Td booster every 10 years.  Varicella vaccine. An adult without evidence of immunity to varicella should receive 2 doses or a second dose if she has previously received 1 dose. Pregnant females who do not have evidence of immunity should receive the first dose after pregnancy. This first dose should be obtained before leaving the health care facility. The second dose should be obtained 4-8 weeks after the first dose.  Human papillomavirus (HPV) vaccine. Females aged 13-26 years who have not received the vaccine previously should obtain the 3-dose series. The vaccine is not recommended for use in pregnant  females. However, pregnancy testing is not needed before receiving a dose. If a female is found to be pregnant after receiving a dose, no treatment is needed. In that case, the remaining doses should be delayed until after the pregnancy. Immunization is recommended for any person with an immunocompromised condition through the age of 68 years if she did not get any or all doses earlier. During the 3-dose series, the second dose should be obtained 4-8 weeks after the first dose. The third dose should be obtained 24 weeks after the first dose and 16 weeks after the second dose.  Zoster vaccine. One dose is recommended for adults aged 15 years or older unless certain conditions are present.  Measles, mumps, and rubella (MMR) vaccine. Adults born before [birth year] generally are considered immune to measles and mumps. Adults born in [birth year] or later should have 1 or more doses of MMR vaccine unless there is a contraindication to the vaccine or there is laboratory evidence of immunity to each of the three diseases. A routine second dose of MMR vaccine should be obtained at least 28 days after the first dose for students attending postsecondary  schools, health care workers, or international travelers. People who received inactivated measles vaccine or an unknown type of measles vaccine during 1963-1967 should receive 2 doses of MMR vaccine. People who received inactivated mumps vaccine or an unknown type of mumps vaccine before 1979 and are at high risk for mumps infection should consider immunization with 2 doses of MMR vaccine. For females of childbearing age, rubella immunity should be determined. If there is no evidence of immunity, females who are not pregnant should be vaccinated. If there is no evidence of immunity, females who are pregnant should delay immunization until after pregnancy. Unvaccinated health care workers born before [birth year] who lack laboratory evidence of measles, mumps, or rubella immunity or laboratory confirmation of disease should consider measles and mumps immunization with 2 doses of MMR vaccine or rubella immunization with 1 dose of MMR vaccine.  Pneumococcal 13-valent conjugate (PCV13) vaccine. When indicated, a person who is uncertain of her immunization history and has no record of immunization should receive the PCV13 vaccine. An adult aged 35 years or older who has certain medical conditions and has not been previously immunized should receive 1 dose of PCV13 vaccine. This PCV13 should be followed with a dose of pneumococcal polysaccharide (PPSV23) vaccine. The PPSV23 vaccine dose should be obtained at least 8 weeks after the dose of PCV13 vaccine. An adult aged 33 years or older who has certain medical conditions and previously received 1 or more doses of PPSV23 vaccine should receive 1 dose of PCV13. The PCV13 vaccine dose should be obtained 1 or more years after the last PPSV23 vaccine dose.    Pneumococcal polysaccharide (PPSV23) vaccine. When PCV13 is also indicated, PCV13 should be obtained first. All adults aged 108 years and older should be immunized. An adult younger than age 68 years who has certain medical  conditions should be immunized. Any person who resides in a nursing home or long-term care facility should be immunized. An adult smoker should be immunized. People with an immunocompromised condition and certain other conditions should receive both PCV13 and PPSV23 vaccines. People with human immunodeficiency virus (HIV) infection should be immunized as soon as possible after diagnosis. Immunization during chemotherapy or radiation therapy should be avoided. Routine use of PPSV23 vaccine is not recommended for American Indians, Bright Natives, or people younger than 65 years unless there are medical  conditions that require PPSV23 vaccine. When indicated, people who have unknown immunization and have no record of immunization should receive PPSV23 vaccine. One-time revaccination 5 years after the first dose of PPSV23 is recommended for people aged 19-64 years who have chronic kidney failure, nephrotic syndrome, asplenia, or immunocompromised conditions. People who received 1-2 doses of PPSV23 before age 57 years should receive another dose of PPSV23 vaccine at age 68 years or later if at least 5 years have passed since the previous dose. Doses of PPSV23 are not needed for people immunized with PPSV23 at or after age 20 years.  Preventive Services / Frequency   Ages 93 to 53 years  Blood pressure check.  Lipid and cholesterol check.  Lung cancer screening. / Every year if you are aged 39-80 years and have a 30-pack-year history of smoking and currently smoke or have quit within the past 15 years. Yearly screening is stopped once you have quit smoking for at least 15 years or develop a health problem that would prevent you from having lung cancer treatment.  Clinical breast exam.** / Every year after age 76 years.  BRCA-related cancer risk assessment.** / For women who have family members with a BRCA-related cancer (breast, ovarian, tubal, or peritoneal cancers).  Mammogram.** / Every year beginning  at age 30 years and continuing for as long as you are in good health. Consult with your health care provider.  Pap test.** / Every 3 years starting at age 25 years through age 63 or 34 years with a history of 3 consecutive normal Pap tests.  HPV screening.** / Every 3 years from ages 48 years through ages 28 to 66 years with a history of 3 consecutive normal Pap tests.  Fecal occult blood test (FOBT) of stool. / Every year beginning at age 64 years and continuing until age 22 years. You may not need to do this test if you get a colonoscopy every 10 years.  Flexible sigmoidoscopy or colonoscopy.** / Every 5 years for a flexible sigmoidoscopy or every 10 years for a colonoscopy beginning at age 30 years and continuing until age 69 years.  Hepatitis C blood test.** / For all people born from [birth year] through 1965 and any individual with known risks for hepatitis C.  Skin self-exam. / Monthly.  Influenza vaccine. / Every year.  Tetanus, diphtheria, and acellular pertussis (Tdap/Td) vaccine.** / Consult your health care provider. Pregnant women should receive 1 dose of Tdap vaccine during each pregnancy. 1 dose of Td every 10 years.  Varicella vaccine.** / Consult your health care provider. Pregnant females who do not have evidence of immunity should receive the first dose after pregnancy.  Zoster vaccine.** / 1 dose for adults aged 59 years or older.  Pneumococcal 13-valent conjugate (PCV13) vaccine.** / Consult your health care provider.  Pneumococcal polysaccharide (PPSV23) vaccine.** / 1 to 2 doses if you smoke cigarettes or if you have certain conditions.  Meningococcal vaccine.** / Consult your health care provider.  Hepatitis A vaccine.** / Consult your health care provider.  Hepatitis B vaccine.** / Consult your health care provider. Screening for abdominal aortic aneurysm (AAA)  by ultrasound is recommended for people over 50 who have history of high blood pressure or who are current  or former smokers.
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
## 2016-02-02 LAB — URINALYSIS, ROUTINE W REFLEX MICROSCOPIC
BILIRUBIN URINE: NEGATIVE
GLUCOSE, UA: NEGATIVE
HGB URINE DIPSTICK: NEGATIVE
KETONES UR: NEGATIVE
Leukocytes, UA: NEGATIVE
Nitrite: NEGATIVE
PH: 7.5 (ref 5.0–8.0)
PROTEIN: NEGATIVE
Specific Gravity, Urine: 1.017 (ref 1.001–1.035)

## 2016-02-02 LAB — VITAMIN D 25 HYDROXY (VIT D DEFICIENCY, FRACTURES): VIT D 25 HYDROXY: 6 ng/mL — AB (ref 30–100)

## 2016-02-02 LAB — MICROALBUMIN / CREATININE URINE RATIO
Creatinine, Urine: 116 mg/dL (ref 20–320)
MICROALB/CREAT RATIO: 9 ug/mg{creat} (ref <30)
Microalb, Ur: 1.1 mg/dL

## 2016-04-19 DIAGNOSIS — E042 Nontoxic multinodular goiter: Secondary | ICD-10-CM | POA: Diagnosis not present

## 2016-05-16 ENCOUNTER — Other Ambulatory Visit: Payer: Self-pay | Admitting: Internal Medicine

## 2016-05-16 MED ORDER — ESTRADIOL 0.05 MG/24HR TD PTTW: 1.0000 | MEDICATED_PATCH | TRANSDERMAL | Status: DC

## 2016-10-09 DIAGNOSIS — H10013 Acute follicular conjunctivitis, bilateral: Secondary | ICD-10-CM | POA: Diagnosis not present

## 2016-11-08 ENCOUNTER — Other Ambulatory Visit: Payer: Self-pay | Admitting: Internal Medicine

## 2017-02-05 ENCOUNTER — Encounter: Payer: Self-pay | Admitting: Physician Assistant

## 2017-02-15 ENCOUNTER — Ambulatory Visit (INDEPENDENT_AMBULATORY_CARE_PROVIDER_SITE_OTHER): Payer: BLUE CROSS/BLUE SHIELD | Admitting: Internal Medicine

## 2017-02-15 ENCOUNTER — Encounter: Payer: Self-pay | Admitting: Internal Medicine

## 2017-02-15 VITALS — BP 124/70 | HR 86 | Temp 98.2°F | Resp 16 | Ht 69.5 in | Wt 136.0 lb

## 2017-02-15 DIAGNOSIS — J0100 Acute maxillary sinusitis, unspecified: Secondary | ICD-10-CM

## 2017-02-15 MED ORDER — AZITHROMYCIN 250 MG PO TABS: ORAL_TABLET | ORAL | 0 refills | Status: DC

## 2017-02-15 MED ORDER — PROMETHAZINE-PHENYLEPHRINE 6.25-5 MG/5ML PO SYRP: 5.0000 mL | ORAL_SOLUTION | Freq: Four times a day (QID) | ORAL | 0 refills | Status: DC | PRN

## 2017-02-15 MED ORDER — AZELASTINE HCL 0.1 % NA SOLN: 2.0000 | Freq: Two times a day (BID) | NASAL | 2 refills | Status: DC

## 2017-02-15 MED ORDER — PREDNISONE 20 MG PO TABS: ORAL_TABLET | ORAL | 0 refills | Status: DC

## 2017-02-15 NOTE — Progress Notes (Signed)
HPI  Patient presents to the office for evaluation of cough.  It has been going on for 5 days.  Patient reports night > day, dry, barky, worse with lying down.  They also endorse change in voice, chills, postnasal drip and nasal congestion with blood and yellow color, ear congestion, sore throat, headaches..  They have tried xyzal, nasal saline, flonase, alka seltzer cold, sudafed.  They report that nothing has worked.  They admits to other sick contacts.  Her coworkers are also sick currently.    Review of Systems  Constitutional: Positive for malaise/fatigue. Negative for chills and fever.  HENT: Positive for congestion, ear pain, hearing loss and sore throat.   Respiratory: Positive for cough. Negative for sputum production, shortness of breath and wheezing.   Cardiovascular: Negative for chest pain, palpitations and leg swelling.  Neurological: Positive for headaches.    PE:  Vitals:   02/15/17 1122  BP: 124/70  Pulse: 86  Resp: 16  Temp: 98.2 F (36.8 C)   General:  Alert and non-toxic, WDWN, NAD HEENT: NCAT, PERLA, EOM normal, no occular discharge or erythema.  Nasal mucosal edema with sinus tenderness to palpation.  Oropharynx clear with minimal oropharyngeal edema and erythema.  Mucous membranes moist and pink. Neck:  Cervical adenopathy Chest:  RRR no MRGs.  Lungs clear to auscultation A&P with no wheezes rhonchi or rales.   Abdomen: +BS x 4 quadrants, soft, non-tender, no guarding, rigidity, or rebound. Skin: warm and dry no rash Neuro: A&Ox4, CN II-XII grossly intact  Assessment and Plan:   1. Acute non-recurrent maxillary sinusitis -cont xyzal -cont nasal saline -can take benadryl 50 mg at bedtime if desired -call office if no relief.  - predniSONE (DELTASONE) 20 MG tablet; 3 tabs po daily x 3 days, then 2 tabs x 3 days, then 1.5 tabs x 3 days, then 1 tab x 3 days, then 0.5 tabs x 3 days  Dispense: 27 tablet; Refill: 0 - azelastine (ASTELIN) 0.1 % nasal spray; Place  2 sprays into both nostrils 2 (two) times daily. Use in each nostril as directed  Dispense: 30 mL; Refill: 2 - promethazine-phenylephrine (PROMETHAZINE VC) 6.25-5 MG/5ML SYRP; Take 5 mLs by mouth every 6 (six) hours as needed for congestion.  Dispense: 180 mL; Refill: 0 - azithromycin (ZITHROMAX Z-PAK) 250 MG tablet; 2 po day one, then 1 daily x 4 days  Dispense: 6 tablet; Refill: 0

## 2017-02-27 ENCOUNTER — Ambulatory Visit (INDEPENDENT_AMBULATORY_CARE_PROVIDER_SITE_OTHER): Payer: BLUE CROSS/BLUE SHIELD | Admitting: Physician Assistant

## 2017-02-27 ENCOUNTER — Encounter: Payer: Self-pay | Admitting: Physician Assistant

## 2017-02-27 VITALS — BP 120/72 | HR 68 | Temp 97.3°F | Resp 14 | Ht 70.0 in | Wt 138.4 lb

## 2017-02-27 DIAGNOSIS — H9202 Otalgia, left ear: Secondary | ICD-10-CM

## 2017-02-27 DIAGNOSIS — H6122 Impacted cerumen, left ear: Secondary | ICD-10-CM

## 2017-02-27 DIAGNOSIS — Z872 Personal history of diseases of the skin and subcutaneous tissue: Secondary | ICD-10-CM

## 2017-02-27 DIAGNOSIS — Z23 Encounter for immunization: Secondary | ICD-10-CM

## 2017-02-27 DIAGNOSIS — R232 Flushing: Secondary | ICD-10-CM

## 2017-02-27 DIAGNOSIS — E041 Nontoxic single thyroid nodule: Secondary | ICD-10-CM

## 2017-02-27 DIAGNOSIS — Z79899 Other long term (current) drug therapy: Secondary | ICD-10-CM | POA: Diagnosis not present

## 2017-02-27 DIAGNOSIS — E042 Nontoxic multinodular goiter: Secondary | ICD-10-CM | POA: Diagnosis not present

## 2017-02-27 DIAGNOSIS — K5900 Constipation, unspecified: Secondary | ICD-10-CM

## 2017-02-27 DIAGNOSIS — Z0001 Encounter for general adult medical examination with abnormal findings: Secondary | ICD-10-CM | POA: Diagnosis not present

## 2017-02-27 DIAGNOSIS — Z1159 Encounter for screening for other viral diseases: Secondary | ICD-10-CM | POA: Diagnosis not present

## 2017-02-27 DIAGNOSIS — Z114 Encounter for screening for human immunodeficiency virus [HIV]: Secondary | ICD-10-CM

## 2017-02-27 DIAGNOSIS — G43809 Other migraine, not intractable, without status migrainosus: Secondary | ICD-10-CM | POA: Diagnosis not present

## 2017-02-27 DIAGNOSIS — Z13220 Encounter for screening for lipoid disorders: Secondary | ICD-10-CM

## 2017-02-27 DIAGNOSIS — D649 Anemia, unspecified: Secondary | ICD-10-CM

## 2017-02-27 DIAGNOSIS — R6889 Other general symptoms and signs: Secondary | ICD-10-CM

## 2017-02-27 DIAGNOSIS — E559 Vitamin D deficiency, unspecified: Secondary | ICD-10-CM | POA: Diagnosis not present

## 2017-02-27 DIAGNOSIS — Z1389 Encounter for screening for other disorder: Secondary | ICD-10-CM

## 2017-02-27 DIAGNOSIS — Z0000 Encounter for general adult medical examination without abnormal findings: Secondary | ICD-10-CM

## 2017-02-27 DIAGNOSIS — T7840XD Allergy, unspecified, subsequent encounter: Secondary | ICD-10-CM

## 2017-02-27 LAB — BASIC METABOLIC PANEL WITH GFR
BUN: 16 mg/dL (ref 7–25)
CHLORIDE: 101 mmol/L (ref 98–110)
CO2: 30 mmol/L (ref 20–31)
CREATININE: 1.04 mg/dL (ref 0.50–1.05)
Calcium: 9.6 mg/dL (ref 8.6–10.4)
GFR, Est African American: 69 mL/min (ref >=60)
GFR, Est Non African American: 60 mL/min (ref >=60)
Glucose, Bld: 79 mg/dL (ref 65–99)
POTASSIUM: 4.2 mmol/L (ref 3.5–5.3)
Sodium: 137 mmol/L (ref 135–146)

## 2017-02-27 LAB — CBC WITH DIFFERENTIAL/PLATELET
Basophils Absolute: 0 cells/uL (ref 0–200)
Basophils Relative: 0 %
EOS ABS: 0 {cells}/uL — AB (ref 15–500)
Eosinophils Relative: 0 %
HCT: 39.8 % (ref 35.0–45.0)
HEMOGLOBIN: 13.4 g/dL (ref 11.7–15.5)
LYMPHS ABS: 4560 {cells}/uL — AB (ref 850–3900)
Lymphocytes Relative: 40 %
MCH: 30.9 pg (ref 27.0–33.0)
MCHC: 33.7 g/dL (ref 32.0–36.0)
MCV: 91.9 fL (ref 80.0–100.0)
MONO ABS: 912 {cells}/uL (ref 200–950)
MPV: 9.6 fL (ref 7.5–12.5)
Monocytes Relative: 8 %
NEUTROS ABS: 5928 {cells}/uL (ref 1500–7800)
Neutrophils Relative %: 52 %
Platelets: 334 10*3/uL (ref 140–400)
RBC: 4.33 MIL/uL (ref 3.80–5.10)
RDW: 14 % (ref 11.0–15.0)
WBC: 11.4 10*3/uL — ABNORMAL HIGH (ref 3.8–10.8)

## 2017-02-27 LAB — LIPID PANEL
CHOL/HDL RATIO: 1.6 ratio (ref <5.0)
CHOLESTEROL: 202 mg/dL — AB (ref <200)
HDL: 126 mg/dL (ref >50)
LDL CALC: 67 mg/dL (ref <100)
TRIGLYCERIDES: 45 mg/dL (ref <150)
VLDL: 9 mg/dL (ref <30)

## 2017-02-27 LAB — IRON AND TIBC
%SAT: 26 % (ref 11–50)
IRON: 98 ug/dL (ref 45–160)
TIBC: 378 ug/dL (ref 250–450)
UIBC: 280 ug/dL (ref 125–400)

## 2017-02-27 LAB — VITAMIN B12: Vitamin B-12: >2000 pg/mL — ABNORMAL HIGH (ref 200–1100)

## 2017-02-27 LAB — HEPATIC FUNCTION PANEL
ALK PHOS: 71 U/L (ref 33–130)
ALT: 17 U/L (ref 6–29)
AST: 14 U/L (ref 10–35)
Albumin: 4.5 g/dL (ref 3.6–5.1)
BILIRUBIN DIRECT: 0.1 mg/dL (ref <=0.2)
Indirect Bilirubin: 0.3 mg/dL (ref 0.2–1.2)
Total Bilirubin: 0.4 mg/dL (ref 0.2–1.2)
Total Protein: 7.9 g/dL (ref 6.1–8.1)

## 2017-02-27 LAB — FERRITIN: FERRITIN: 32 ng/mL (ref 10–232)

## 2017-02-27 MED ORDER — ESTRADIOL 0.075 MG/24HR TD PTTW: 1.0000 | MEDICATED_PATCH | TRANSDERMAL | 12 refills | Status: DC

## 2017-02-27 NOTE — Progress Notes (Signed)
Complete Physical  Assessment and Plan: Thyroid nodule - TSH  H/O keloid of skin Avoid procedures.   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  - CBC with Differential/Platelet - Lipid panel - continue baclofen PRN   Vitamin D deficiency - Vit D  25 hydroxy (rtn osteoporosis monitoring)  Anemia, unspecified anemia type - Iron and TIBC - Ferritin  Allergy, subsequent encounter Continue medications  Constipation, unspecified constipation type Get on benefiber, increase water, exercise and due for colonoscopy  Screening cholesterol level - Lipid panel  Screening for blood or protein in urine - Urinalysis, Routine w reflex microscopic (not at [facility]) - [facility]albumin / [facility] urine ratio   Medication management - CBC with Differential/Platelet - BASIC METABOLIC PANEL WITH GFR - Hepatic function panel - Magnesium   Routine general medical examination at a health care facility Get [facility], call about cologuard/need colonoscopy but have high deductible.   Need for diphtheria-tetanus-pertussis (Tdap) vaccine, adult/adolescent -     Tdap vaccine greater than or equal to 7yo IM  Ear pain, left and Left ear impacted cerumen - stop using Qtips, irrigation used in the office without complications, use OTC drops/oil at home to prevent reoccurence  Hot flashes Follow up GYN -     estradiol (VIVELLE-DOT) 0.075 MG/24HR; Place 1 patch onto the skin 2 (two) times a week.  Screening for HIV without presence of risk factors -     HIV antibody  Need for hepatitis C screening test -     Hepatitis C antibody   Discussed med's effects and SE's. Screening labs and tests as requested with regular follow-up as recommended.  HPI  This very nice 56 y.o.female presents for complete physical.  Patient has no major health issues.    She does workout. No CP, SOB.  Lab Results  Component Value Date   CHOL 183  02/01/2016   HDL 91 02/01/2016   LDLCALC 80 02/01/2016   TRIG 58 02/01/2016   CHOLHDL 2.0 02/01/2016   Finally, patient has history of Vitamin D Deficiency.  Currently NOT on supplementation Lab Results  Component Value Date   VD25OH 6 (L) 02/01/2016   She has a history of anemia Lab Results  Component Value Date   IRON 120 02/01/2016   TIBC 349 02/01/2016   FERRITIN 42 02/01/2016   She has a history of migraines with aura, she gets them very infrequently, will take excedrin PM which will help, she has been taking baclofen PRN for sleep.  She follows with her OB/GYN, Dr. Waldron Labs has left and will get new DR, in [place]. She is on vivelle-dot and the cream as needed, she is not on bASA.  Has thyroid nodules, recent normal [place], will continue to monitor labs and palpation.  She is Statistician, she is an Barrister's clerk.  BMI is Body mass index is 19.86 kg/m., she is working on diet and exercise. Wt Readings from Last 3 Encounters:  02/27/17 138 lb 6.4 oz (62.8 kg)  02/15/17 136 lb (61.7 kg)  02/01/16 134 lb 6.4 oz (61 kg)    Current Medications:  Current Outpatient Prescriptions on File Prior to Visit  Medication Sig Dispense Refill  . azelastine (ASTELIN) 0.1 % nasal spray Place 2 sprays into both nostrils 2 (two) times daily. Use in each nostril as directed 30 mL 2  . estradiol (ESTRACE) 0.1 MG/GM vaginal cream     . estradiol (VIVELLE-DOT) 0.05 MG/24HR  patch PLACE 1 PATCH (0.05 MG TOTAL) ONTO THE SKIN 2 (TWO) TIMES A WEEK. 8 patch 3  . fluticasone (FLONASE) 50 MCG/ACT nasal spray Place 1 spray into both nostrils daily.    . predniSONE (DELTASONE) 20 MG tablet 3 tabs po daily x 3 days, then 2 tabs x 3 days, then 1.5 tabs x 3 days, then 1 tab x 3 days, then 0.5 tabs x 3 days 27 tablet 0  . levocetirizine (XYZAL) 5 MG tablet Take 1 tablet (5 mg total) by mouth every evening. 90 tablet 3  . SUMAtriptan (IMITREX) 100 MG tablet Take 1 tablet (100 mg total) by mouth once as  needed for migraine. May repeat in 2 hours if headache persists or recurs. 30 tablet 2   No current facility-administered medications on file prior to visit.    Health Maintenance:   Immunization History  Administered Date(s) Administered  . Influenza Split 10/03/2013   LMP: No LMP recorded. Patient has had a hysterectomy. Sexually Active: yes STD testing offered, declines Pap: 1 year ago with GYN has had 1 abnormal in the past MGM: 06/2015, very dense breast, get 3D Colonoscopy: Dr. Kinnie Scales, 2011, OVER DUE Last Dental Exam: Dr. Leanord Asal Last Eye Exam: Dr. Veneta Penton. ellington CT head 01/2015  She is divorced, has one 61 year old daughter who is a doctor, in her internship in [place], has 26 year old grandson, Tamika.   Medical History:  Past Medical History:  Diagnosis Date  . Allergy   . Anemia   . Migraine   . Vitamin D deficiency    Allergies No Known Allergies  SURGICAL HISTORY She  has a past surgical history that includes Abdominal hysterectomy (1991). FAMILY HISTORY Her family history includes Brain cancer in her mother; Depression in her father; Mental illness in her father; Stroke in her father. SOCIAL HISTORY She  reports that she has never smoked. She has never used smokeless tobacco. She reports that she drinks alcohol. She reports that she does not use drugs.  Review of Systems: Review of Systems  Constitutional: Negative.   HENT: Negative for congestion, ear discharge, ear pain, hearing loss, nosebleeds, sore throat and tinnitus.   Eyes: Negative.        Improved  Respiratory: Negative.  Negative for stridor.   Cardiovascular: Negative.   Gastrointestinal: Positive for constipation.  Genitourinary: Negative for dysuria, flank pain, frequency, hematuria and urgency.  Musculoskeletal: Negative for back pain, falls, joint pain, myalgias and neck pain.  Skin: Negative.   Neurological: Negative for dizziness, tingling, tremors, sensory change, speech change,  focal weakness, seizures, loss of consciousness and headaches.  Endo/Heme/Allergies: Negative.   Psychiatric/Behavioral: Negative.     Physical Exam: Estimated body mass index is 19.8 kg/m as calculated from the following:   Height as of 02/15/17: 5' 9.5" (1.765 m).   Weight as of 02/15/17: 136 lb (61.7 kg). There were no vitals taken for this visit. General Appearance: Well nourished, in no apparent distress.  Eyes: PERRLA, EOMs, conjunctiva no swelling or erythema, normal fundi and vessels.  Sinuses: No Frontal/maxillary tenderness  ENT/Mouth: Ext aud canals clear, normal light reflex with TMs without erythema, bulging. Good dentition. No erythema, swelling, or exudate on post pharynx. Tonsils not swollen or erythematous. Hearing normal.  Neck: Supple, thyroid enlarged with nodules. No bruits  Respiratory: Respiratory effort normal, BS equal bilaterally without rales, rhonchi, wheezing or stridor.  Cardio: RRR without murmurs, rubs or gallops. Brisk peripheral pulses without edema.  Chest: symmetric, with  normal excursions and percussion.  Breasts: defer  Abdomen: Soft, nontender, no guarding, rebound, hernias, masses, or organomegaly.  Lymphatics: Non tender without lymphadenopathy.  Genitourinary: defer Musculoskeletal: Full ROM all peripheral extremities,5/5 strength, and normal gait.  Skin: Warm, dry without rashes, lesions, ecchymosis. Neuro: Cranial nerves intact, reflexes equal bilaterally. Normal muscle tone, no cerebellar symptoms. Sensation intact.  Psych: Awake and oriented X 3, normal affect, Insight and Judgment appropriate.   EKG: WNL  Quentin Mulling 7:48 AM [facility]

## 2017-02-27 NOTE — Patient Instructions (Addendum)
Encourage you to get the 3D Mammogram  The 3D Mammogram is much more specific and sensitive to pick up breast cancer. For women with fibrocystic breast or lumpy breast it can be hard to determine if it is cancer or not but the 3D mammogram is able to tell this difference which cuts back on unneeded additional tests or scary call backs.   - over 40% increase in detection of breast cancer - over 40% reduction in false positives.  - fewer call backs - reduced anxiety - improved outcomes - [facility]  Add ENTERIC COATED low dose 81 mg Aspirin daily OR can do every other day if you have easy bruising to protect your heart and head. As well as to reduce risk of Colon Cancer by 20 %, Skin Cancer by 26 % , Melanoma by 46% and Pancreatic cancer by 60%  Your ears and sinuses are connected by the eustachian tube. When your sinuses are inflamed, this can close off the tube and cause fluid to collect in your middle ear. This can then cause dizziness, popping, clicking, ringing, and echoing in your ears. This is often NOT an infection and does NOT require antibiotics, it is caused by inflammation so the treatments help the inflammation. This can take a long time to get better so please be patient.  Here are things you can do to help with this: - Try the Flonase or Nasonex. Remember to spray each nostril twice towards the outer part of your eye.  Do not sniff but instead pinch your nose and tilt your head back to help the medicine get into your sinuses.  The best time to do this is at bedtime.Stop if you get blurred vision or nose bleeds.  -While drinking fluids, pinch and hold nose close and swallow, to help open eustachian tubes to drain fluid behind ear drums. -Please pick one of the over the counter allergy medications below and take it once daily for allergies.  It will also help with fluid behind ear drums. Claritin or loratadine cheapest but likely the weakest  Zyrtec or certizine at night because it  can make you sleepy The strongest is allegra or fexafinadine  Cheapest at [employer], [employer], [employer] -can use decongestant over the counter, please do not use if you have high blood pressure or certain heart conditions.   if worsening HA, changes vision/speech, imbalance, weakness go to the ER     Vitamin D goal is between 60-80  Your vitamin D was 6, no that is not an error, it was 6  Get on 5000 IU daily, may have to increase it.   Please make sure that you are taking your Vitamin D as directed.   It is very important as a natural anti-inflammatory   helping hair, skin, and nails, as well as reducing stroke and heart attack risk.   It helps your bones and helps with mood.  It also decreases numerous cancer risks so please take it as directed.   Low Vit D is associated with a 200-300% higher risk for CANCER   and 200-300% higher risk for HEART   ATTACK  &  STROKE.    .....................................Marland Kitchen  It is also associated with higher death rate at younger ages,   autoimmune diseases like Rheumatoid arthritis, Lupus, Multiple Sclerosis.     Also many other serious conditions, like depression, Alzheimer's  Dementia, infertility, muscle aches, fatigue, fibromyalgia - just to name a few.  +++++++++++++++++++  Can get liquid vitamin  D from [facility] here in [place] at  [facility] [address] [place], [place] [address] Or you can try earth fare   Cologuard is an easy to use noninvasive colon cancer screening test based on the latest advances in stool DNA science.   Colon cancer is 3rd most diagnosed cancer and 2nd leading cause of death in both men and women 65 years of age and older despite being one of the most preventable and treatable cancers if found early.  4 of out 5 people diagnosed with colon cancer have NO prior family history.  When caught EARLY 90% of colon cancer is curable.   You have agreed to do a Cologuard screening and have declined a  colonoscopy in spite of being explained the risks and benefits of the colonoscopy in detail, including cancer and death. Please understand that this is test not as sensitive or specific as a colonoscopy and you are still recommended to get a colonoscopy.   If you are NOT medicare please call your insurance company and give them these items to see if they will cover it: 1) CPT code, [phone] 2) Provider is Probation officer 3) [employer] NPI [id number] 4) [place] Tax ID [phone]  Out-of-pocket cost for Cologuard can range from $0 - $649 so please call  You will receive a short call from [address] support center at [employer], when you receive a call they will say they are from [facility],  to confirm your mailing address and give you more information.  When they calll you, it will appear on the caller ID as "[employer]" or in some cases only this number will appear, [phone].   [employer] will ship your collection kit directly to you. You will collect a single stool sample in the privacy of your own home, no special preparation required. You will return the kit via [facility] pre-paid shipping or pick-up, in the same box it arrived in. Then I will contact you to discuss your results after I receive them from the laboratory.   If you have any questions or concerns, Cologuard Customer Support Specialist are available 24 hours a day, 7 days a week at [phone] or go to [email or web address].

## 2017-02-28 ENCOUNTER — Other Ambulatory Visit: Payer: Self-pay | Admitting: Physician Assistant

## 2017-02-28 DIAGNOSIS — D649 Anemia, unspecified: Secondary | ICD-10-CM

## 2017-02-28 DIAGNOSIS — E559 Vitamin D deficiency, unspecified: Secondary | ICD-10-CM

## 2017-02-28 LAB — MAGNESIUM: Magnesium: 2.2 mg/dL (ref 1.5–2.5)

## 2017-02-28 LAB — MICROALBUMIN / CREATININE URINE RATIO
CREATININE, URINE: 213 mg/dL (ref 20–320)
Microalb Creat Ratio: 2 mcg/mg creat (ref <30)
Microalb, Ur: 0.4 mg/dL

## 2017-02-28 LAB — URINALYSIS, ROUTINE W REFLEX MICROSCOPIC
BILIRUBIN URINE: NEGATIVE
Glucose, UA: NEGATIVE
Hgb urine dipstick: NEGATIVE
Ketones, ur: NEGATIVE
Leukocytes, UA: NEGATIVE
Nitrite: NEGATIVE
PH: 6 (ref 5.0–8.0)
Protein, ur: NEGATIVE
SPECIFIC GRAVITY, URINE: 1.021 (ref 1.001–1.035)

## 2017-02-28 LAB — VITAMIN D 25 HYDROXY (VIT D DEFICIENCY, FRACTURES): Vit D, 25-Hydroxy: 6 ng/mL — ABNORMAL LOW (ref 30–100)

## 2017-02-28 LAB — HIV ANTIBODY (ROUTINE TESTING W REFLEX): HIV 1&2 Ab, 4th Generation: NONREACTIVE

## 2017-02-28 LAB — TSH: TSH: 1.04 mIU/L

## 2017-02-28 LAB — HEPATITIS C ANTIBODY: HCV Ab: NEGATIVE

## 2017-05-01 ENCOUNTER — Other Ambulatory Visit: Payer: BLUE CROSS/BLUE SHIELD

## 2017-05-01 DIAGNOSIS — E559 Vitamin D deficiency, unspecified: Secondary | ICD-10-CM | POA: Diagnosis not present

## 2017-05-01 DIAGNOSIS — D649 Anemia, unspecified: Secondary | ICD-10-CM | POA: Diagnosis not present

## 2017-05-01 LAB — CBC WITH DIFFERENTIAL/PLATELET
BASOS ABS: 46 {cells}/uL (ref 0–200)
BASOS PCT: 1 %
EOS ABS: 92 {cells}/uL (ref 15–500)
Eosinophils Relative: 2 %
HCT: 36.6 % (ref 35.0–45.0)
HEMOGLOBIN: 12 g/dL (ref 11.7–15.5)
LYMPHS ABS: 2254 {cells}/uL (ref 850–3900)
Lymphocytes Relative: 49 %
MCH: 30.5 pg (ref 27.0–33.0)
MCHC: 32.8 g/dL (ref 32.0–36.0)
MCV: 92.9 fL (ref 80.0–100.0)
MONOS PCT: 10 %
MPV: 10.2 fL (ref 7.5–12.5)
Monocytes Absolute: 460 cells/uL (ref 200–950)
NEUTROS ABS: 1748 {cells}/uL (ref 1500–7800)
Neutrophils Relative %: 38 %
PLATELETS: 316 10*3/uL (ref 140–400)
RBC: 3.94 MIL/uL (ref 3.80–5.10)
RDW: 13.7 % (ref 11.0–15.0)
WBC: 4.6 10*3/uL (ref 3.8–10.8)

## 2017-05-02 LAB — BASIC METABOLIC PANEL WITH GFR
BUN: 10 mg/dL (ref 7–25)
CALCIUM: 9.5 mg/dL (ref 8.6–10.4)
CHLORIDE: 101 mmol/L (ref 98–110)
CO2: 26 mmol/L (ref 20–31)
Creat: 0.87 mg/dL (ref 0.50–1.05)
GFR, EST NON AFRICAN AMERICAN: 74 mL/min (ref >=60)
GFR, Est African American: 86 mL/min (ref >=60)
GLUCOSE: 65 mg/dL (ref 65–99)
POTASSIUM: 4.1 mmol/L (ref 3.5–5.3)
SODIUM: 137 mmol/L (ref 135–146)

## 2017-05-02 LAB — VITAMIN D 25 HYDROXY (VIT D DEFICIENCY, FRACTURES): VIT D 25 HYDROXY: 48 ng/mL (ref 30–100)

## 2017-11-08 ENCOUNTER — Telehealth: Payer: Self-pay | Admitting: Physician Assistant

## 2017-11-08 MED ORDER — PREDNISONE 20 MG PO TABS: ORAL_TABLET | ORAL | 0 refills | Status: DC

## 2017-11-08 MED ORDER — AZITHROMYCIN 250 MG PO TABS: ORAL_TABLET | ORAL | 1 refills | Status: AC

## 2017-11-08 NOTE — Telephone Encounter (Signed)
Patient calling with sinus x 6 days, sinus congestion, pressure, cough with mucus light yellow, on mucinex, nasal spray and ibuprofen.   Will send in prednisone, add allergy pill, if can wait 2-3 days then take antibiotic if you are not feeling better.

## 2017-12-18 ENCOUNTER — Other Ambulatory Visit: Payer: Self-pay | Admitting: Physician Assistant

## 2017-12-18 ENCOUNTER — Encounter: Payer: Self-pay | Admitting: Physician Assistant

## 2017-12-18 MED ORDER — PREDNISONE 20 MG PO TABS
ORAL_TABLET | ORAL | 0 refills | Status: DC
Start: 2017-12-18 — End: 2018-01-28

## 2017-12-24 MED ORDER — AZITHROMYCIN 250 MG PO TABS
ORAL_TABLET | ORAL | 1 refills | Status: AC
Start: 2017-12-24 — End: 2017-12-29

## 2018-01-28 ENCOUNTER — Encounter: Payer: Self-pay | Admitting: Physician Assistant

## 2018-01-28 ENCOUNTER — Ambulatory Visit (INDEPENDENT_AMBULATORY_CARE_PROVIDER_SITE_OTHER): Payer: BLUE CROSS/BLUE SHIELD | Admitting: Physician Assistant

## 2018-01-28 VITALS — BP 112/72 | HR 98 | Temp 97.5°F | Resp 14 | Ht 70.0 in | Wt 140.6 lb

## 2018-01-28 DIAGNOSIS — M542 Cervicalgia: Secondary | ICD-10-CM

## 2018-01-28 MED ORDER — MELOXICAM 15 MG PO TABS: ORAL_TABLET | ORAL | 1 refills | Status: DC

## 2018-01-28 MED ORDER — CYCLOBENZAPRINE HCL 10 MG PO TABS: 10.0000 mg | ORAL_TABLET | Freq: Every evening | ORAL | 0 refills | Status: DC | PRN

## 2018-01-28 NOTE — Progress Notes (Signed)
Subjective:    Patient ID: Margaret CyphersSheila Dieu, female    DOB: June 28, 1961, 57 y.o.   MRN: [id number]  HPI 57 y.o. right handed AA female presents for neck pain. She was on vacation in [place] for 3 weeks and when she got back, she had neck pain and shooting right arm pain/shoulder blade pain. She states was lifting/active in the yard yesterday, has been worse today after activity. No pain with activity, no SOB, no fever, chills. Feels very tight. No numbness/tingling/weakness/dropping things in that arm. No HA, changes in vision.   Blood pressure 112/72, pulse 98, temperature (!) 97.5 F (36.4 C), resp. rate 14, height 5\' 10"  (1.778 m), weight 140 lb 9.6 oz (63.8 kg), SpO2 98 %.  Medications Current Outpatient Medications on File Prior to Visit  Medication Sig  . azelastine (ASTELIN) 0.1 % nasal spray Place 2 sprays into both nostrils 2 (two) times daily. Use in each nostril as directed  . estradiol (ESTRACE) 0.1 MG/GM vaginal cream   . estradiol (VIVELLE-DOT) 0.075 MG/24HR Place 1 patch onto the skin 2 (two) times a week.  . fluticasone (FLONASE) 50 MCG/ACT nasal spray Place 1 spray into both nostrils daily.  Marland Kitchen. levocetirizine (XYZAL) 5 MG tablet Take 1 tablet (5 mg total) by mouth every evening.  . SUMAtriptan (IMITREX) 100 MG tablet Take 1 tablet (100 mg total) by mouth once as needed for migraine. May repeat in 2 hours if headache persists or recurs.   No current facility-administered medications on file prior to visit.     Problem list She has Allergy; Anemia; Migraine; Vitamin D deficiency; Thyroid nodule; H/O keloid of skin; Constipation; and Multinodular goiter on their problem list.   Review of Systems  Constitutional: Negative.  Negative for chills, fatigue and fever.  HENT: Negative.  Negative for congestion, sore throat and trouble swallowing.   Respiratory: Negative.   Cardiovascular: Negative.   Genitourinary: Negative.   Musculoskeletal: Positive for neck pain and neck  stiffness. Negative for arthralgias, back pain, gait problem, joint swelling and myalgias.  Skin: Negative.  Negative for rash.  Neurological: Negative.  Negative for dizziness, tremors, seizures, syncope, facial asymmetry, speech difficulty, weakness, light-headedness, numbness and headaches.  Psychiatric/Behavioral: Negative for agitation and confusion.       Objective:   Physical Exam  Constitutional: She is oriented to person, place, and time. She appears well-developed and well-nourished.  Eyes: Conjunctivae are normal. Pupils are equal, round, and reactive to light.  Neck: Normal range of motion. Neck supple.  Cardiovascular: Normal rate and regular rhythm.  Pulmonary/Chest: Effort normal and breath sounds normal.  Musculoskeletal: She exhibits tenderness.  normal range of motion, without spinous process tenderness, with paraspinal muscle tenderness the right side, normal sensation, reflexes, and pulses distal.  Lymphadenopathy:    She has no cervical adenopathy.  Neurological: She is alert and oriented to person, place, and time. She has normal reflexes. No cranial nerve deficit. She exhibits normal muscle tone.  Skin: Skin is warm. No rash noted. She is not diaphoretic.      Assessment & Plan:    Acute neck pain -     meloxicam (MOBIC) 15 MG tablet; Take one daily with food for 2 weeks, can take with tylenol, can not take with aleve, iburpofen, then as needed daily for pain -     cyclobenzaprine (FLEXERIL) 10 MG tablet; Take 1 tablet (10 mg total) by mouth at bedtime as needed for muscle spasms. Heat, massage, exercises given, if not better  follow up for trigger point or will refer to ortho  The patient was advised to call immediately if she has any concerning symptoms in the interval. The patient voices understanding of current treatment options and is in agreement with the current care plan.The patient knows to call the clinic with any problems, questions or concerns or go to  the ER if any further progression of symptoms.

## 2018-01-28 NOTE — Patient Instructions (Signed)
Try the exercises and other information below, meloxicam once during the day as needed (avoid taking other NSAIDS like Alleve or Ibuprofen while taking this) and then flexeril if needed at bedtime for muscle spasm. This can be taken up to every 8 hours, but causes sedation, so should not drive or operate heavy machinery while taking this medicine.   Go to the ER if you have any new weakness in your arms, trouble with your grip, worse headache ever, fever, chills. or have worsening pain.   If you are not better in 1-3 month we will refer you to ortho   Cervical Sprain A cervical sprain is a stretch or tear in one or more of the tough, cord-like tissues that connect bones (ligaments) in the neck. Cervical sprains can range from mild to severe. Severe cervical sprains can cause the spinal bones (vertebrae) in the neck to be unstable. This can lead to spinal cord damage and can result in serious nervous system problems. The amount of time that it takes for a cervical sprain to get better depends on the cause and extent of the injury. Most cervical sprains heal in 4-6 weeks. What are the causes? Cervical sprains may be caused by an injury (trauma), such as from a motor vehicle accident, a fall, or sudden forward and backward whipping movement of the head and neck (whiplash injury). Mild cervical sprains may be caused by wear and tear over time, such as from poor posture, sitting in a chair that does not provide support, or looking up or down for long periods of time. What increases the risk? The following factors may make you more likely to develop this condition:  Participating in activities that have a high risk of trauma to the neck. These include contact sports, auto racing, gymnastics, and diving.  Taking risks when driving or riding in a motor vehicle, such as speeding.  Having osteoarthritis of the spine.  Having poor strength and flexibility of the neck.  A previous neck injury.  Having  poor posture.  Spending a lot of time in certain positions that put stress on the neck, such as sitting at a computer for long periods of time.  What are the signs or symptoms? Symptoms of this condition include:  Pain, soreness, stiffness, tenderness, swelling, or a burning sensation in the front, back, or sides of the neck.  Sudden tightening of neck muscles that you cannot control (muscle spasms).  Pain in the shoulders or upper back.  Limited ability to move the neck.  Headache.  Dizziness.  Nausea.  Vomiting.  Weakness, numbness, or tingling in a hand or an arm.  Symptoms may develop right away after injury, or they may develop over a few days. In some cases, symptoms may go away with treatment and return (recur) over time. How is this diagnosed? This condition may be diagnosed based on:  Your medical history.  Your symptoms.  Any recent injuries or known neck problems that you have, such as arthritis in the neck.  A physical exam.  Imaging tests, such as: ? X-rays. ? MRI. ? CT scan.  How is this treated? This condition is treated by resting and icing the injured area and doing physical therapy exercises. Depending on the severity of your condition, treatment may also include:  Keeping your neck in place (immobilized) for periods of time. This may be done using: ? A cervical collar. This supports your chin and the back of your head. ? A cervical traction device.   This is a sling that holds up your head. This removes weight and pressure from your neck, and it may help to relieve pain.  Medicines that help to relieve pain and inflammation.  Medicines that help to relax your muscles (muscle relaxants).  Surgery. This is rare.  Follow these instructions at home: If you have a cervical collar:  Wear it as told by your health care provider. Do not remove the collar unless instructed by your health care provider.  Ask your health care provider before you make  any adjustments to your collar.  If you have long hair, keep it outside of the collar.  Ask your health care provider if you can remove the collar for cleaning and bathing. If you are allowed to remove the collar for cleaning or bathing: ? Follow instructions from your health care provider about how to remove the collar safely. ? Clean the collar by wiping it with mild soap and water and drying it completely. ? If your collar has removable pads, remove them every 1-2 days and wash them by hand with soap and water. Let them air-dry completely before you put them back in the collar. ? Check your skin under the collar for irritation or sores. If you see any, tell your health care provider. Managing pain, stiffness, and swelling  If directed, use a cervical traction device as told by your health care provider.  If directed, apply heat to the affected area before you do your physical therapy or as often as told by your health care provider. Use the heat source that your health care provider recommends, such as a moist heat pack or a heating pad. ? Place a towel between your skin and the heat source. ? Leave the heat on for 20-30 minutes. ? Remove the heat if your skin turns bright red. This is especially important if you are unable to feel pain, heat, or cold. You may have a greater risk of getting burned.  If directed, put ice on the affected area: ? Put ice in a plastic bag. ? Place a towel between your skin and the bag. ? Leave the ice on for 20 minutes, 2-3 times a day. Activity  Do not drive while wearing a cervical collar. If you do not have a cervical collar, ask your health care provider if it is safe to drive while your neck heals.  Do not drive or use heavy machinery while taking prescription pain medicine or muscle relaxants, unless your health care provider approves.  Do not lift anything that is heavier than 10 lb (4.5 kg) until your health care provider tells you that it is  safe.  Rest as directed by your health care provider. Avoid positions and activities that make your symptoms worse. Ask your health care provider what activities are safe for you.  If physical therapy was prescribed, do exercises as told by your health care provider or physical therapist. General instructions  Take over-the-counter and prescription medicines only as told by your health care provider.  Do not use any products that contain nicotine or tobacco, such as cigarettes and e-cigarettes. These can delay healing. If you need help quitting, ask your health care provider.  Keep all follow-up visits as told by your health care provider or physical therapist. This is important. How is this prevented? To prevent a cervical sprain from happening again:  Use and maintain good posture. Make any needed adjustments to your workstation to help you use good posture.  Exercise   regularly as directed by your health care provider or physical therapist.  Avoid risky activities that may cause a cervical sprain.  Contact a health care provider if:  You have symptoms that get worse or do not get better after 2 weeks of treatment.  You have pain that gets worse or does not get better with medicine.  You develop new, unexplained symptoms.  You have sores or irritated skin on your neck from wearing your cervical collar. Get help right away if:  You have severe pain.  You develop numbness, tingling, or weakness in any part of your body.  You cannot move a part of your body (you have paralysis).  You have neck pain along with: ? Severe dizziness. ? Headache. Summary  A cervical sprain is a stretch or tear in one or more of the tough, cord-like tissues that connect bones (ligaments) in the neck.  Cervical sprains may be caused by an injury (trauma), such as from a motor vehicle accident, a fall, or sudden forward and backward whipping movement of the head and neck (whiplash  injury).  Symptoms may develop right away after injury, or they may develop over a few days.  This condition is treated by resting and icing the injured area and doing physical therapy exercises. This information is not intended to replace advice given to you by your health care provider. Make sure you discuss any questions you have with your health care provider. Document Released: 09/03/2007 Document Revised: 07/05/2016 Document Reviewed: 07/05/2016 [employer] Interactive Patient Education  2017 [employer]   Cervical Strain and Sprain Rehab Ask your health care provider which exercises are safe for you. Do exercises exactly as told by your health care provider and adjust them as directed. It is normal to feel mild stretching, pulling, tightness, or discomfort as you do these exercises, but you should stop right away if you feel sudden pain or your pain gets worse.Do not begin these exercises until told by your health care provider. Stretching and range of motion exercises These exercises warm up your muscles and joints and improve the movement and flexibility of your neck. These exercises also help to relieve pain, numbness, and tingling. Exercise A: Cervical side bend  1. Using good posture, sit on a stable chair or stand up. 2. Without moving your shoulders, slowly tilt your left / right ear to your shoulder until you feel a stretch in your neck muscles. You should be looking straight ahead. 3. Hold for __________ seconds. 4. Repeat with the other side of your neck. Repeat __________ times. Complete this exercise __________ times a day. Exercise B: Cervical rotation  1. Using good posture, sit on a stable chair or stand up. 2. Slowly turn your head to the side as if you are looking over your left / right shoulder. ? Keep your eyes level with the ground. ? Stop when you feel a stretch along the side and the back of your neck. 3. Hold for __________ seconds. 4. Repeat this by turning  to your other side. Repeat __________ times. Complete this exercise __________ times a day. Exercise C: Thoracic extension and pectoral stretch 1. Roll a towel or a small blanket so it is about 4 inches (10 cm) in diameter. 2. Lie down on your back on a firm surface. 3. Put the towel lengthwise, under your spine in the middle of your back. It should not be not under your shoulder blades. The towel should line up with your spine from your   middle back to your lower back. 4. Put your hands behind your head and let your elbows fall out to your sides. 5. Hold for __________ seconds. Repeat __________ times. Complete this exercise __________ times a day. Strengthening exercises These exercises build strength and endurance in your neck. Endurance is the ability to use your muscles for a long time, even after your muscles get tired. Exercise D: Upper cervical flexion, isometric 1. Lie on your back with a thin pillow behind your head and a small rolled-up towel under your neck. 2. Gently tuck your chin toward your chest and nod your head down to look toward your feet. Do not lift your head off the pillow. 3. Hold for __________ seconds. 4. Release the tension slowly. Relax your neck muscles completely before you repeat this exercise. Repeat __________ times. Complete this exercise __________ times a day. Exercise E: Cervical extension, isometric  1. Stand about 6 inches (15 cm) away from a wall, with your back facing the wall. 2. Place a soft object, about 6-8 inches (15-20 cm) in diameter, between the back of your head and the wall. A soft object could be a small pillow, a ball, or a folded towel. 3. Gently tilt your head back and press into the soft object. Keep your jaw and forehead relaxed. 4. Hold for __________ seconds. 5. Release the tension slowly. Relax your neck muscles completely before you repeat this exercise. Repeat __________ times. Complete this exercise __________ times a  day. Posture and body mechanics  Body mechanics refers to the movements and positions of your body while you do your daily activities. Posture is part of body mechanics. Good posture and healthy body mechanics can help to relieve stress in your body's tissues and joints. Good posture means that your spine is in its natural S-curve position (your spine is neutral), your shoulders are pulled back slightly, and your head is not tipped forward. The following are general guidelines for applying improved posture and body mechanics to your everyday activities. Standing  When standing, keep your spine neutral and keep your feet about hip-width apart. Keep a slight bend in your knees. Your ears, shoulders, and hips should line up.  When you do a task in which you stand in one place for a long time, place one foot up on a stable object that is 2-4 inches (5-10 cm) high, such as a footstool. This helps keep your spine neutral. Sitting   When sitting, keep your spine neutral and your keep feet flat on the floor. Use a footrest, if necessary, and keep your thighs parallel to the floor. Avoid rounding your shoulders, and avoid tilting your head forward.  When working at a desk or a computer, keep your desk at a height where your hands are slightly lower than your elbows. Slide your chair under your desk so you are close enough to maintain good posture.  When working at a computer, place your monitor at a height where you are looking straight ahead and you do not have to tilt your head forward or downward to look at the screen. Resting When lying down and resting, avoid positions that are most painful for you. Try to support your neck in a neutral position. You can use a contour pillow or a small rolled-up towel. Your pillow should support your neck but not push on it. This information is not intended to replace advice given to you by your health care provider. Make sure you discuss any questions you have   with  your health care provider. Document Released: 11/06/2005 Document Revised: 07/13/2016 Document Reviewed: 10/13/2015 [employer] Interactive Patient Education  2018 [employer]   

## 2018-02-27 NOTE — Progress Notes (Deleted)
Complete Physical  Assessment and Plan: Thyroid nodule - TSH  H/O keloid of skin Avoid procedures.   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  - CBC with Differential/Platelet - Lipid panel - continue baclofen PRN   Vitamin D deficiency - Vit D  25 hydroxy (rtn osteoporosis monitoring)  Anemia, unspecified anemia type - Iron and TIBC - Ferritin  Allergy, subsequent encounter Continue medications  Constipation, unspecified constipation type Get on benefiber, increase water, exercise and due for colonoscopy  Screening cholesterol level - Lipid panel  Screening for blood or protein in urine - Urinalysis, Routine w reflex microscopic (not at [facility]) - Microalbumin / creatinine urine ratio   Medication management - CBC with Differential/Platelet - BASIC METABOLIC PANEL WITH GFR - Hepatic function panel - Magnesium   Routine general medical examination at a health care facility Get [facility], call about cologuard/need colonoscopy but have high deductible.   Need for diphtheria-tetanus-pertussis (Tdap) vaccine, adult/adolescent -     Tdap vaccine greater than or equal to 7yo IM  Ear pain, left and Left ear impacted cerumen - stop using Qtips, irrigation used in the office without complications, use OTC drops/oil at home to prevent reoccurence  Hot flashes Follow up GYN -     estradiol (VIVELLE-DOT) 0.075 MG/24HR; Place 1 patch onto the skin 2 (two) times a week.  Screening for HIV without presence of risk factors -     HIV antibody  Need for hepatitis C screening test -     Hepatitis C antibody   Discussed med's effects and SE's. Screening labs and tests as requested with regular follow-up as recommended.  HPI  This very nice 57 y.o.female presents for complete physical.  Patient has no major health issues.    She does workout. No CP, SOB.  Lab Results  Component Value Date   CHOL 202 (H)  02/27/2017   HDL 126 02/27/2017   LDLCALC 67 02/27/2017   TRIG 45 02/27/2017   CHOLHDL 1.6 02/27/2017   Finally, patient has history of Vitamin D Deficiency.  Currently NOT on supplementation Lab Results  Component Value Date   VD25OH 48 05/01/2017   She has a history of anemia Lab Results  Component Value Date   IRON 98 02/27/2017   TIBC 378 02/27/2017   FERRITIN 32 02/27/2017   She has a history of migraines with aura, she gets them very infrequently, will take excedrin PM which will help, she has been taking baclofen PRN for sleep.  She follows with her OB/GYN, Dr. Waldron Labs has left and will get new DR, in [place]. She is on vivelle-dot and the cream as needed, she is not on bASA.  Has thyroid nodules, recent normal [place], will continue to monitor labs and palpation.  She is Statistician, she is an Barrister's clerk.  BMI is There is no height or weight on file to calculate BMI., she is working on diet and exercise. Wt Readings from Last 3 Encounters:  01/28/18 140 lb 9.6 oz (63.8 kg)  02/27/17 138 lb 6.4 oz (62.8 kg)  02/15/17 136 lb (61.7 kg)    Current Medications:  Current Outpatient Medications on File Prior to Visit  Medication Sig Dispense Refill  . azelastine (ASTELIN) 0.1 % nasal spray Place 2 sprays into both nostrils 2 (two) times daily. Use in each nostril as directed 30 mL 2  . cyclobenzaprine (FLEXERIL) 10 MG tablet Take 1 tablet (10 mg  total) by mouth at bedtime as needed for muscle spasms. 30 tablet 0  . estradiol (ESTRACE) 0.1 MG/GM vaginal cream     . estradiol (VIVELLE-DOT) 0.075 MG/24HR Place 1 patch onto the skin 2 (two) times a week. 8 patch 12  . fluticasone (FLONASE) 50 MCG/ACT nasal spray Place 1 spray into both nostrils daily.    Marland Kitchen levocetirizine (XYZAL) 5 MG tablet Take 1 tablet (5 mg total) by mouth every evening. 90 tablet 3  . meloxicam (MOBIC) 15 MG tablet Take one daily with food for 2 weeks, can take with tylenol, can not take with aleve,  iburpofen, then as needed daily for pain 30 tablet 1  . SUMAtriptan (IMITREX) 100 MG tablet Take 1 tablet (100 mg total) by mouth once as needed for migraine. May repeat in 2 hours if headache persists or recurs. 30 tablet 2   No current facility-administered medications on file prior to visit.    Health Maintenance:   Immunization History  Administered Date(s) Administered  . Influenza Split 10/03/2013  . Tdap 02/27/2017   LMP: No LMP recorded. Patient has had a hysterectomy. Sexually Active: yes STD testing offered, declines Pap: 1 year ago with GYN has had 1 abnormal in the past MGM: 06/2015, very dense breast, get 3D Colonoscopy: Dr. Kinnie Scales, 2011, OVER DUE Last Dental Exam: Dr. Leanord Asal Last Eye Exam: Dr. Veneta Penton. [place] CT head 01/2015  She is divorced, has one 33 year old daughter who is a doctor, in her internship in [place], has 55 year old grandson, Tamika.   Medical History:  Past Medical History:  Diagnosis Date  . Allergy   . Anemia   . Migraine   . Vitamin D deficiency    Allergies No Known Allergies  SURGICAL HISTORY She  has a past surgical history that includes Abdominal hysterectomy (1991). FAMILY HISTORY Her family history includes Brain cancer in her mother; Depression in her father; Mental illness in her father; Stroke in her father. SOCIAL HISTORY She  reports that she has never smoked. She has never used smokeless tobacco. She reports that she drinks alcohol. She reports that she does not use drugs.  Review of Systems: Review of Systems  Constitutional: Negative.   HENT: Negative for congestion, ear discharge, ear pain, hearing loss, nosebleeds, sore throat and tinnitus.   Eyes: Negative.        Improved  Respiratory: Negative.  Negative for stridor.   Cardiovascular: Negative.   Gastrointestinal: Positive for constipation.  Genitourinary: Negative for dysuria, flank pain, frequency, hematuria and urgency.  Musculoskeletal: Negative for back  pain, falls, joint pain, myalgias and neck pain.  Skin: Negative.   Neurological: Negative for dizziness, tingling, tremors, sensory change, speech change, focal weakness, seizures, loss of consciousness and headaches.  Endo/Heme/Allergies: Negative.   Psychiatric/Behavioral: Negative.     Physical Exam: Estimated body mass index is 20.17 kg/m as calculated from the following:   Height as of 01/28/18: 5\' 10"  (1.778 m).   Weight as of 01/28/18: 140 lb 9.6 oz (63.8 kg). There were no vitals taken for this visit. General Appearance: Well nourished, in no apparent distress.  Eyes: PERRLA, EOMs, conjunctiva no swelling or erythema, normal fundi and vessels.  Sinuses: No Frontal/maxillary tenderness  ENT/Mouth: Ext aud canals clear, normal light reflex with TMs without erythema, bulging. Good dentition. No erythema, swelling, or exudate on post pharynx. Tonsils not swollen or erythematous. Hearing normal.  Neck: Supple, thyroid enlarged with nodules. No bruits  Respiratory: Respiratory effort normal, BS  equal bilaterally without rales, rhonchi, wheezing or stridor.  Cardio: RRR without murmurs, rubs or gallops. Brisk peripheral pulses without edema.  Chest: symmetric, with normal excursions and percussion.  Breasts: defer  Abdomen: Soft, nontender, no guarding, rebound, hernias, masses, or organomegaly.  Lymphatics: Non tender without lymphadenopathy.  Genitourinary: defer Musculoskeletal: Full ROM all peripheral extremities,5/5 strength, and normal gait.  Skin: Warm, dry without rashes, lesions, ecchymosis. Neuro: Cranial nerves intact, reflexes equal bilaterally. Normal muscle tone, no cerebellar symptoms. Sensation intact.  Psych: Awake and oriented X 3, normal affect, Insight and Judgment appropriate.   EKG: WNL  Quentin MullingAmanda Jasiri Hanawalt 7:42 AM [facility]

## 2018-02-28 ENCOUNTER — Encounter: Payer: Self-pay | Admitting: Physician Assistant

## 2018-03-16 ENCOUNTER — Other Ambulatory Visit: Payer: Self-pay | Admitting: Physician Assistant

## 2018-03-16 DIAGNOSIS — R232 Flushing: Secondary | ICD-10-CM

## 2018-04-08 NOTE — Progress Notes (Deleted)
Complete Physical  Assessment and Plan:  Diagnoses and all orders for this visit:  Routine general medical examination at a health care facility  Other migraine without status migrainosus, not intractable ***  Multinodular goiter Continue to monitor, treat if becomes hyperthyroid TSH  Vitamin D deficiency Continue supplementation Check vitamin D level  H/O keloid of skin Avoid procedures  Constipation, unspecified constipation type - Increase fiber/ water intake, decrease caffeine, increase activity level, continue with current meds.  Please go to the hospital if you have severe abdominal pain, vomiting, fever, CP, SOB.   Anemia, unspecified type CBC, B12  Allergic state, subsequent encounter Continue OTC allergy pills  Medication management CBC, CMP/GFR, magnesium  Screening for hematuria or proteinuria UA, microalbumin  Screening for diabetes mellitus A1C  Screening cholesterol level Lipid panel  Screening for cardiovascular condition EKG    Discussed med's effects and SE's. Screening labs and tests as requested with regular follow-up as recommended. Over 40 minutes of exam, counseling, chart review, and complex, high level critical decision making was performed this visit.   Future Appointments  Date Time Provider Department Center  04/09/2018  3:00 PM Judd Gaudier, NP [facility] None  04/16/2019  3:00 PM Judd Gaudier, NP [facility] None     HPI  57 y.o. female  presents for a complete physical and follow up for has Allergy; Anemia; Migraine; Vitamin D deficiency; H/O keloid of skin; Constipation; and Multinodular goiter on their problem list. She is followed by GYN ***  Migraines ***  BMI is There is no height or weight on file to calculate BMI., she {HAS HAS ZOX:09604} been working on diet and exercise. Wt Readings from Last 3 Encounters:  01/28/18 140 lb 9.6 oz (63.8 kg)  02/27/17 138 lb 6.4 oz (62.8 kg)  02/15/17 136 lb (61.7 kg)   Her  blood pressure {HAS HAS NOT:18834} been controlled at home, today their BP is   She {DOES_DOES VWU:98119} workout. She denies chest pain, shortness of breath, dizziness.   She is not on cholesterol medication and denies myalgias. Her cholesterol is at goal. The cholesterol last visit was:   Lab Results  Component Value Date   CHOL 202 (H) 02/27/2017   HDL 126 02/27/2017   LDLCALC 67 02/27/2017   TRIG 45 02/27/2017   CHOLHDL 1.6 02/27/2017   Last GFR: Lab Results  Component Value Date   GFRNONAA 74 05/01/2017   Patient is on Vitamin D supplement.   Lab Results  Component Value Date   VD25OH 48 05/01/2017      Current Medications:  Current Outpatient Medications on File Prior to Visit  Medication Sig Dispense Refill  . azelastine (ASTELIN) 0.1 % nasal spray Place 2 sprays into both nostrils 2 (two) times daily. Use in each nostril as directed 30 mL 2  . cyclobenzaprine (FLEXERIL) 10 MG tablet Take 1 tablet (10 mg total) by mouth at bedtime as needed for muscle spasms. 30 tablet 0  . estradiol (ESTRACE) 0.1 MG/GM vaginal cream     . estradiol (VIVELLE-DOT) 0.075 MG/24HR APPLY AND CHANGE 1 PATCH ONTO THE SKIN 2 (TWO) TIMES A WEEK. 8 patch 11  . fluticasone (FLONASE) 50 MCG/ACT nasal spray Place 1 spray into both nostrils daily.    Marland Kitchen levocetirizine (XYZAL) 5 MG tablet Take 1 tablet (5 mg total) by mouth every evening. 90 tablet 3  . meloxicam (MOBIC) 15 MG tablet Take one daily with food for 2 weeks, can take with tylenol, can not take with aleve,  iburpofen, then as needed daily for pain 30 tablet 1  . SUMAtriptan (IMITREX) 100 MG tablet Take 1 tablet (100 mg total) by mouth once as needed for migraine. May repeat in 2 hours if headache persists or recurs. 30 tablet 2   No current facility-administered medications on file prior to visit.    Allergies:  No Known Allergies Medical History:  She has Allergy; Anemia; Migraine; Vitamin D deficiency; H/O keloid of skin; Constipation; and  Multinodular goiter on their problem list. Health Maintenance:   Immunization History  Administered Date(s) Administered  . Influenza Split 10/03/2013  . Tdap 02/27/2017   LMP: No LMP recorded. Patient has had a hysterectomy. Sexually Active: yes STD testing offered, declines Pap: managed by GYN, has had 1 abnormal in the past, last 2017? *** MGM: 06/2015, very dense breast, get 3D Colonoscopy: Dr. Kinnie Scales, 2011, OVER DUE CT head 01/2015  Last Dental Exam: Dr. Leanord Asal Last Eye Exam: Dr. Veneta Penton. [place]   Patient Care Team: Lucky Cowboy, MD as PCP - General (Internal Medicine) Sharrell Ku, MD as Consulting Physician (Gastroenterology)  Surgical History:  She has a past surgical history that includes Abdominal hysterectomy (1991). Family History:  Herfamily history includes Brain cancer in her mother; Depression in her father; Mental illness in her father; Stroke in her father. Social History:  She reports that she has never smoked. She has never used smokeless tobacco. She reports that she drinks alcohol. She reports that she does not use drugs.  Review of Systems: Review of Systems  Constitutional: Negative for malaise/fatigue and weight loss.  HENT: Negative for hearing loss and tinnitus.   Eyes: Negative for blurred vision and double vision.  Respiratory: Negative for cough, sputum production, shortness of breath and wheezing.   Cardiovascular: Negative for chest pain, palpitations, orthopnea, claudication, leg swelling and PND.  Gastrointestinal: Negative for abdominal pain, blood in stool, constipation, diarrhea, heartburn, melena, nausea and vomiting.  Genitourinary: Negative.   Musculoskeletal: Negative for falls, joint pain and myalgias.  Skin: Negative for rash.  Neurological: Negative for dizziness, tingling, sensory change, weakness and headaches.  Endo/Heme/Allergies: Negative for polydipsia.  Psychiatric/Behavioral: Negative.  Negative for depression,  memory loss, substance abuse and suicidal ideas. The patient is not nervous/anxious and does not have insomnia.   All other systems reviewed and are negative.   Physical Exam: Estimated body mass index is 20.17 kg/m as calculated from the following:   Height as of 01/28/18:  (1.778 m).   Weight as of 01/28/18: 140 lb 9.6 oz (63.8 kg). There were no vitals taken for this visit. General Appearance: Well nourished, in no apparent distress.  Eyes: PERRLA, EOMs, conjunctiva no swelling or erythema, normal fundi and vessels.  Sinuses: No Frontal/maxillary tenderness  ENT/Mouth: Ext aud canals clear, normal light reflex with TMs without erythema, bulging. Good dentition. No erythema, swelling, or exudate on post pharynx. Tonsils not swollen or erythematous. Hearing normal.  Neck: Supple, thyroid normal. No bruits  Respiratory: Respiratory effort normal, BS equal bilaterally without rales, rhonchi, wheezing or stridor.  Cardio: RRR without murmurs, rubs or gallops. Brisk peripheral pulses without edema.  Chest: symmetric, with normal excursions and percussion.  Breasts: Symmetric, without lumps, nipple discharge, retractions.  Abdomen: Soft, nontender, no guarding, rebound, hernias, masses, or organomegaly.  Lymphatics: Non tender without lymphadenopathy.  Genitourinary:  Musculoskeletal: Full ROM all peripheral extremities,5/5 strength, and normal gait.  Skin: Warm, dry without rashes, lesions, ecchymosis. Neuro: Cranial nerves intact, reflexes equal bilaterally. Normal muscle tone, no  cerebellar symptoms. Sensation intact.  Psych: Awake and oriented X 3, normal affect, Insight and Judgment appropriate.   EKG: WNL no ST changes.  Dan Maker 8:34 AM [facility]

## 2018-04-09 ENCOUNTER — Encounter: Payer: Self-pay | Admitting: Adult Health

## 2018-05-08 ENCOUNTER — Encounter: Payer: Self-pay | Admitting: Physician Assistant

## 2018-05-15 DIAGNOSIS — Z79890 Hormone replacement therapy (postmenopausal): Secondary | ICD-10-CM | POA: Diagnosis not present

## 2018-05-15 DIAGNOSIS — Z1231 Encounter for screening mammogram for malignant neoplasm of breast: Secondary | ICD-10-CM | POA: Diagnosis not present

## 2018-05-15 DIAGNOSIS — Z01419 Encounter for gynecological examination (general) (routine) without abnormal findings: Secondary | ICD-10-CM | POA: Diagnosis not present

## 2018-05-15 DIAGNOSIS — N952 Postmenopausal atrophic vaginitis: Secondary | ICD-10-CM | POA: Diagnosis not present

## 2018-08-01 ENCOUNTER — Other Ambulatory Visit: Payer: Self-pay | Admitting: Physician Assistant

## 2018-08-01 MED ORDER — PROMETHAZINE HCL 25 MG PO TABS: 25.0000 mg | ORAL_TABLET | Freq: Four times a day (QID) | ORAL | 0 refills | Status: DC | PRN

## 2018-09-02 NOTE — Progress Notes (Signed)
Complete Physical  Assessment and Plan: Thyroid nodule - TSH  H/O keloid of skin Avoid procedures.   Other migraine without status migrainosus, not intractable - continue baclofen PRN   Vitamin D deficiency - Vit D  25 hydroxy (rtn osteoporosis monitoring)  Anemia, unspecified anemia type - Iron and TIBC - Ferritin  Allergy, subsequent encounter Continue medications  Screening cholesterol level - Lipid panel  Screening for blood or protein in urine - Urinalysis, Routine w reflex microscopic (not at [facility]) - Microalbumin / creatinine urine ratio   Medication management - CBC with Differential/Platelet - BASIC METABOLIC PANEL WITH GFR - Hepatic function panel - Magnesium   Routine general medical examination at a health care facility Get [facility], call about cologuard/need colonoscopy but have high deductible.   Hot flashes Follow up GYN  Discussed med's effects and SE's. Screening labs and tests as requested with regular follow-up as recommended.  HPI  This very nice 57 y.o.female presents for complete physical.  Patient has no major health issues.    She does workout. No CP, SOB.  Lab Results  Component Value Date   CHOL 202 (H) 02/27/2017   HDL 126 02/27/2017   LDLCALC 67 02/27/2017   TRIG 45 02/27/2017   CHOLHDL 1.6 02/27/2017   Finally, patient has history of Vitamin D Deficiency.  Currently NOT on supplementation Lab Results  Component Value Date   VD25OH 48 05/01/2017   She has a history of anemia Lab Results  Component Value Date   IRON 98 02/27/2017   TIBC 378 02/27/2017   FERRITIN 32 02/27/2017   She has a history of migraines with aura, she gets them very infrequently, will take excedrin PM which will help, she has been taking baclofen PRN for sleep.  She follows with her OB/GYN. She is on vivelle-dot and the cream as needed, she is not on bASA.  Has thyroid nodules, recent normal [place], will continue to monitor labs and palpation.  She is Nature conservation officer, she is an Barrister's clerk.  BMI is Body mass index is 20.09 kg/m., she is working on diet and exercise. Wt Readings from Last 3 Encounters:  09/03/18 140 lb (63.5 kg)  01/28/18 140 lb 9.6 oz (63.8 kg)  02/27/17 138 lb 6.4 oz (62.8 kg)    Current Medications:  Current Outpatient Medications on File Prior to Visit  Medication Sig Dispense Refill  . cyclobenzaprine (FLEXERIL) 10 MG tablet Take 1 tablet (10 mg total) by mouth at bedtime as needed for muscle spasms. 30 tablet 0  . estradiol (ESTRACE) 0.1 MG/GM vaginal cream     . estradiol (VIVELLE-DOT) 0.075 MG/24HR APPLY AND CHANGE 1 PATCH ONTO THE SKIN 2 (TWO) TIMES A WEEK. 8 patch 11  . fluticasone (FLONASE) 50 MCG/ACT nasal spray Place 1 spray into both nostrils daily.    . meloxicam (MOBIC) 15 MG tablet Take one daily with food for 2 weeks, can take with tylenol, can not take with aleve, iburpofen, then as needed daily for pain 30 tablet 1  . promethazine (PHENERGAN) 25 MG tablet Take 1 tablet (25 mg total) by mouth every 6 (six) hours as needed for nausea or vomiting (can cause fatigue). Max: 4 tablets per day 30 tablet 0  . azelastine (ASTELIN) 0.1 % nasal spray Place 2 sprays into both nostrils 2 (two) times daily. Use in each nostril as directed 30 mL 2  . levocetirizine (XYZAL) 5 MG tablet Take 1 tablet (5 mg total) by mouth every evening. 90  tablet 3  . SUMAtriptan (IMITREX) 100 MG tablet Take 1 tablet (100 mg total) by mouth once as needed for migraine. May repeat in 2 hours if headache persists or recurs. 30 tablet 2   No current facility-administered medications on file prior to visit.    Health Maintenance:   Immunization History  Administered Date(s) Administered  . Influenza Split 10/03/2013  . Tdap 02/27/2017   LMP: No LMP recorded. Patient has had a hysterectomy. Sexually Active: yes STD testing offered, declines Pap: 1 year ago with GYN has had 1 abnormal in the past MGM: 06/2015, very dense breast, get  3D Colonoscopy: Dr. Kinnie Scales, 2011, OVER DUE Last Dental Exam: Dr. Leanord Asal Last Eye Exam: Dr. Veneta Penton. [place] CT head 01/2015  She is divorced, has one 82 year old daughter who is a doctor, in her internship in [place], has 69 year old grandson, Tamika, and 61 month old granddaughter naomi.   Medical History:  Past Medical History:  Diagnosis Date  . Allergy   . Anemia   . Migraine   . Vitamin D deficiency    Allergies No Known Allergies  SURGICAL HISTORY She  has a past surgical history that includes Abdominal hysterectomy (1991). FAMILY HISTORY Her family history includes Brain cancer in her mother; Depression in her father; Mental illness in her father; Stroke in her father. SOCIAL HISTORY She  reports that she has never smoked. She has never used smokeless tobacco. She reports that she drinks alcohol. She reports that she does not use drugs.  Review of Systems: Review of Systems  Constitutional: Negative.   HENT: Negative for congestion, ear discharge, ear pain, hearing loss, nosebleeds, sore throat and tinnitus.   Eyes: Negative.        Improved  Respiratory: Negative.  Negative for stridor.   Cardiovascular: Negative.   Gastrointestinal: Negative for constipation.  Genitourinary: Negative for dysuria, flank pain, frequency, hematuria and urgency.  Musculoskeletal: Negative for back pain, falls, joint pain, myalgias and neck pain.  Skin: Negative.   Neurological: Negative for dizziness, tingling, tremors, sensory change, speech change, focal weakness, seizures, loss of consciousness and headaches.  Endo/Heme/Allergies: Negative.   Psychiatric/Behavioral: Negative.     Physical Exam: Estimated body mass index is 20.09 kg/m as calculated from the following:   Height as of this encounter: 5\' 10"  (1.778 m).   Weight as of this encounter: 140 lb (63.5 kg). BP 110/70   Pulse 68   Temp 97.8 F (36.6 C)   Resp 14   Ht 5\' 10"  (1.778 m)   Wt 140 lb (63.5 kg)   SpO2 94%    BMI 20.09 kg/m  General Appearance: Well nourished, in no apparent distress.  Eyes: PERRLA, EOMs, conjunctiva no swelling or erythema, normal fundi and vessels.  Sinuses: No Frontal/maxillary tenderness  ENT/Mouth: Ext aud canals clear, normal light reflex with TMs without erythema, bulging. Good dentition. No erythema, swelling, or exudate on post pharynx. Tonsils not swollen or erythematous. Hearing normal.  Neck: Supple, thyroid enlarged with nodules. No bruits  Respiratory: Respiratory effort normal, BS equal bilaterally without rales, rhonchi, wheezing or stridor.  Cardio: RRR without murmurs, rubs or gallops. Brisk peripheral pulses without edema.  Chest: symmetric, with normal excursions and percussion.  Breasts: defer  Abdomen: Soft, nontender, no guarding, rebound, hernias, masses, or organomegaly.  Lymphatics: Non tender without lymphadenopathy.  Genitourinary: defer Musculoskeletal: Full ROM all peripheral extremities,5/5 strength, and normal gait.  Skin: Warm, dry without rashes, lesions, ecchymosis. Neuro: Cranial nerves intact,  reflexes equal bilaterally. Normal muscle tone, no cerebellar symptoms. Sensation intact.  Psych: Awake and oriented X 3, normal affect, Insight and Judgment appropriate.   EKG: WNL  Quentin Mulling 3:16 PM [facility]

## 2018-09-03 ENCOUNTER — Ambulatory Visit (INDEPENDENT_AMBULATORY_CARE_PROVIDER_SITE_OTHER): Payer: BLUE CROSS/BLUE SHIELD | Admitting: Physician Assistant

## 2018-09-03 ENCOUNTER — Encounter: Payer: Self-pay | Admitting: Physician Assistant

## 2018-09-03 VITALS — BP 110/70 | HR 68 | Temp 97.8°F | Resp 14 | Ht 70.0 in | Wt 140.0 lb

## 2018-09-03 DIAGNOSIS — E042 Nontoxic multinodular goiter: Secondary | ICD-10-CM

## 2018-09-03 DIAGNOSIS — Z79899 Other long term (current) drug therapy: Secondary | ICD-10-CM

## 2018-09-03 DIAGNOSIS — T7840XD Allergy, unspecified, subsequent encounter: Secondary | ICD-10-CM

## 2018-09-03 DIAGNOSIS — E559 Vitamin D deficiency, unspecified: Secondary | ICD-10-CM | POA: Diagnosis not present

## 2018-09-03 DIAGNOSIS — Z1329 Encounter for screening for other suspected endocrine disorder: Secondary | ICD-10-CM | POA: Diagnosis not present

## 2018-09-03 DIAGNOSIS — G43809 Other migraine, not intractable, without status migrainosus: Secondary | ICD-10-CM

## 2018-09-03 DIAGNOSIS — Z13220 Encounter for screening for lipoid disorders: Secondary | ICD-10-CM

## 2018-09-03 DIAGNOSIS — D649 Anemia, unspecified: Secondary | ICD-10-CM

## 2018-09-03 DIAGNOSIS — Z1389 Encounter for screening for other disorder: Secondary | ICD-10-CM | POA: Diagnosis not present

## 2018-09-03 DIAGNOSIS — Z0000 Encounter for general adult medical examination without abnormal findings: Secondary | ICD-10-CM | POA: Diagnosis not present

## 2018-09-03 DIAGNOSIS — Z23 Encounter for immunization: Secondary | ICD-10-CM

## 2018-09-03 DIAGNOSIS — Z1211 Encounter for screening for malignant neoplasm of colon: Secondary | ICD-10-CM

## 2018-09-03 DIAGNOSIS — Z872 Personal history of diseases of the skin and subcutaneous tissue: Secondary | ICD-10-CM

## 2018-09-03 NOTE — Patient Instructions (Addendum)
HOW TO SCHEDULE A MAMMOGRAM  [facility]  7 a.m.-6:30 p.m., Monday 7 a.m.-5 p.m., Tuesday-Friday Schedule an appointment by calling [phone].  COLOGUARD INFORMATION   Cologuard is an easy to use noninvasive colon cancer screening test based on the latest advances in stool DNA science.   Colon cancer is 3rd most diagnosed cancer and 2nd leading cause of death in both men and women 58 years of age and older despite being one of the most preventable and treatable cancers if found early.  4 of out 5 people diagnosed with colon cancer have NO prior family history.  When caught EARLY 90% of colon cancer is curable.   More than 92% of cologuard patients have NO out of pocket cost for screening however only your insurer can confirm how Cologuard would be covered for you. Cologuard has a team of specialist that can help you contact your insurer and ask the right questions. Please call [phone] so they can help.   You will receive a short call from [place] support center at [employer], when you receive a call they will say they are from [place],  to confirm your mailing address and give you more information.  When they calll you, it will appear on the caller ID as "[facility]" or in some cases only this number will appear, [phone].   [facility] [employer] will ship your collection kit directly to you. You will collect a single stool sample in the privacy of your own home, no special preparation required. You will return the kit via [place] pre-paid shipping or pick-up, in the same box it arrived in. Then I will contact you to discuss your results after I receive them from the laboratory.   If you have any questions or concerns, [facility] Customer Support Specialist are available 24 hours a day, 7 days a week at [phone] or go to [email or web address].

## 2018-09-04 ENCOUNTER — Encounter: Payer: Self-pay | Admitting: Physician Assistant

## 2018-09-04 LAB — CBC WITH DIFFERENTIAL/PLATELET
Basophils Absolute: 28 cells/uL (ref 0–200)
Basophils Relative: 0.6 %
EOS ABS: 28 {cells}/uL (ref 15–500)
Eosinophils Relative: 0.6 %
HCT: 37.2 % (ref 35.0–45.0)
Hemoglobin: 12.6 g/dL (ref 11.7–15.5)
Lymphs Abs: 2327 cells/uL (ref 850–3900)
MCH: 30.7 pg (ref 27.0–33.0)
MCHC: 33.9 g/dL (ref 32.0–36.0)
MCV: 90.7 fL (ref 80.0–100.0)
MPV: 10.7 fL (ref 7.5–12.5)
Monocytes Relative: 7.9 %
NEUTROS PCT: 41.4 %
Neutro Abs: 1946 cells/uL (ref 1500–7800)
PLATELETS: 254 10*3/uL (ref 140–400)
RBC: 4.1 10*6/uL (ref 3.80–5.10)
RDW: 12 % (ref 11.0–15.0)
TOTAL LYMPHOCYTE: 49.5 %
WBC: 4.7 10*3/uL (ref 3.8–10.8)
WBCMIX: 371 {cells}/uL (ref 200–950)

## 2018-09-04 LAB — MICROALBUMIN / CREATININE URINE RATIO
CREATININE, URINE: 75 mg/dL (ref 20–275)
MICROALB UR: 0.5 mg/dL
Microalb Creat Ratio: 7 mcg/mg creat (ref <30)

## 2018-09-04 LAB — URINALYSIS, ROUTINE W REFLEX MICROSCOPIC
Bilirubin Urine: NEGATIVE
Glucose, UA: NEGATIVE
HGB URINE DIPSTICK: NEGATIVE
KETONES UR: NEGATIVE
Leukocytes, UA: NEGATIVE
NITRITE: NEGATIVE
PROTEIN: NEGATIVE
SPECIFIC GRAVITY, URINE: 1.011 (ref 1.001–1.03)
pH: 7.5 (ref 5.0–8.0)

## 2018-09-04 LAB — TSH: TSH: 1.9 mIU/L (ref 0.40–4.50)

## 2018-09-04 LAB — COMPLETE METABOLIC PANEL WITH GFR
AG RATIO: 1.4 (calc) (ref 1.0–2.5)
ALT: 11 U/L (ref 6–29)
AST: 17 U/L (ref 10–35)
Albumin: 4.6 g/dL (ref 3.6–5.1)
Alkaline phosphatase (APISO): 70 U/L (ref 33–130)
BILIRUBIN TOTAL: 0.5 mg/dL (ref 0.2–1.2)
BUN: 9 mg/dL (ref 7–25)
CHLORIDE: 102 mmol/L (ref 98–110)
CO2: 29 mmol/L (ref 20–32)
Calcium: 10 mg/dL (ref 8.6–10.4)
Creat: 0.82 mg/dL (ref 0.50–1.05)
GFR, EST AFRICAN AMERICAN: 91 mL/min/{1.73_m2} (ref >=60)
GFR, Est Non African American: 79 mL/min/{1.73_m2} (ref >=60)
Globulin: 3.2 g/dL (calc) (ref 1.9–3.7)
Glucose, Bld: 84 mg/dL (ref 65–99)
POTASSIUM: 4.3 mmol/L (ref 3.5–5.3)
Sodium: 138 mmol/L (ref 135–146)
Total Protein: 7.8 g/dL (ref 6.1–8.1)

## 2018-09-04 LAB — LIPID PANEL
CHOLESTEROL: 193 mg/dL (ref <200)
HDL: 94 mg/dL (ref >50)
LDL Cholesterol (Calc): 88 mg/dL (calc)
NON-HDL CHOLESTEROL (CALC): 99 mg/dL (ref <130)
Total CHOL/HDL Ratio: 2.1 (calc) (ref <5.0)
Triglycerides: 39 mg/dL (ref <150)

## 2018-09-04 LAB — VITAMIN D 25 HYDROXY (VIT D DEFICIENCY, FRACTURES): VIT D 25 HYDROXY: 40 ng/mL (ref 30–100)

## 2018-09-04 LAB — MAGNESIUM: Magnesium: 1.9 mg/dL (ref 1.5–2.5)

## 2018-09-04 MED ORDER — BACLOFEN 10 MG PO TABS: 10.0000 mg | ORAL_TABLET | Freq: Every day | ORAL | 1 refills | Status: DC

## 2018-09-04 NOTE — Progress Notes (Signed)
Done

## 2018-09-13 DIAGNOSIS — Z1231 Encounter for screening mammogram for malignant neoplasm of breast: Secondary | ICD-10-CM | POA: Diagnosis not present

## 2018-11-05 ENCOUNTER — Telehealth: Payer: Self-pay

## 2018-11-05 NOTE — Telephone Encounter (Signed)
Pt was called in order to ascertain if she would be completing the COLOGUARD test.    Pt reports she will complete the test & return it to [facility].  Dec. 17th 2019

## 2018-12-12 MED ORDER — PREDNISONE 20 MG PO TABS: ORAL_TABLET | ORAL | 0 refills | Status: DC

## 2018-12-12 MED ORDER — BENZONATATE 200 MG PO CAPS: 200.0000 mg | ORAL_CAPSULE | Freq: Three times a day (TID) | ORAL | 0 refills | Status: DC | PRN

## 2018-12-16 MED ORDER — MECLIZINE HCL 25 MG PO TABS: ORAL_TABLET | ORAL | 0 refills | Status: DC

## 2019-01-07 ENCOUNTER — Other Ambulatory Visit: Payer: Self-pay | Admitting: Physician Assistant

## 2019-01-26 LAB — COLOGUARD: COLOGUARD: NEGATIVE

## 2019-01-30 LAB — HM COLONOSCOPY

## 2019-02-06 ENCOUNTER — Telehealth: Payer: Self-pay

## 2019-02-06 NOTE — Telephone Encounter (Signed)
Message left for patient to return my call.I will continue to try later.  

## 2019-03-10 ENCOUNTER — Encounter: Payer: Self-pay | Admitting: Physician Assistant

## 2019-03-25 ENCOUNTER — Other Ambulatory Visit: Payer: Self-pay | Admitting: Physician Assistant

## 2019-04-07 ENCOUNTER — Telehealth (INDEPENDENT_AMBULATORY_CARE_PROVIDER_SITE_OTHER): Payer: BLUE CROSS/BLUE SHIELD | Admitting: Adult Health

## 2019-04-07 ENCOUNTER — Encounter: Payer: Self-pay | Admitting: Adult Health

## 2019-04-07 DIAGNOSIS — J019 Acute sinusitis, unspecified: Secondary | ICD-10-CM

## 2019-04-07 MED ORDER — AMOXICILLIN-POT CLAVULANATE 875-125 MG PO TABS: 1.0000 | ORAL_TABLET | Freq: Two times a day (BID) | ORAL | 0 refills | Status: AC

## 2019-04-07 MED ORDER — BENZONATATE 200 MG PO CAPS: 200.0000 mg | ORAL_CAPSULE | Freq: Three times a day (TID) | ORAL | 0 refills | Status: DC | PRN

## 2019-04-07 MED ORDER — PREDNISONE 20 MG PO TABS: ORAL_TABLET | ORAL | 0 refills | Status: DC

## 2019-04-07 NOTE — Telephone Encounter (Signed)
Virtual Visit via Telephone Note  I connected with Margaret Bartlett on @TODAY @ at  by telephone and verified that I am speaking with the correct person using two identifiers.  Location: Patient: home Provider: [facility] office    I discussed the limitations, risks, security and privacy concerns of performing an evaluation and management service by telephone and the availability of in person appointments. I also discussed with the patient that there may be a patient responsible charge related to this service. The patient expressed understanding and agreed to proceed.   History of Present Illness:  58 y.o. with hx of allergies reports 6-7 days of scracty chroat, runny nose, congestion, swimmy/lightheaded, sensitive stomach, post-nasal drip, non-productive cough; she also endorses frontal headache and some mild sinus tenderness. Was feeling a bit better 3-4 days ago but then started feeling bad again. She has been checking temp, denies fever/chills. No recent travel, no known sick contacts. She is working from home. Denies chest pains, dyspnea.   She has been taking xyzal daily in the evening,Tylenol sinus severe, Benzonatate (old script)     Current Outpatient Medications:  .  azelastine (ASTELIN) 0.1 % nasal spray, Place 2 sprays into both nostrils 2 (two) times daily. Use in each nostril as directed, Disp: 30 mL, Rfl: 2 .  baclofen (LIORESAL) 10 MG tablet, TAKE 1 TABLET BY MOUTH EVERY DAY, Disp: 30 tablet, Rfl: 1 .  benzonatate (TESSALON) 200 MG capsule, Take 1 capsule (200 mg total) by mouth 3 (three) times daily as needed for cough (Max: 600mg  per day)., Disp: 30 capsule, Rfl: 0 .  cyclobenzaprine (FLEXERIL) 10 MG tablet, Take 1 tablet (10 mg total) by mouth at bedtime as needed for muscle spasms., Disp: 30 tablet, Rfl: 0 .  estradiol (ESTRACE) 0.1 MG/GM vaginal cream, , Disp: , Rfl:  .  estradiol (VIVELLE-DOT) 0.075 MG/24HR, APPLY AND CHANGE 1 PATCH ONTO THE SKIN 2 (TWO) TIMES A WEEK., Disp: 8  patch, Rfl: 11 .  fluticasone (FLONASE) 50 MCG/ACT nasal spray, Place 1 spray into both nostrils daily., Disp: , Rfl:  .  levocetirizine (XYZAL) 5 MG tablet, Take 1 tablet (5 mg total) by mouth every evening., Disp: 90 tablet, Rfl: 3 .  meclizine (ANTIVERT) 25 MG tablet, 1/2-1 pill up to 3 times daily for vertigo/nausea, Disp: 30 tablet, Rfl: 0 .  meloxicam (MOBIC) 15 MG tablet, Take one daily with food for 2 weeks, can take with tylenol, can not take with aleve, iburpofen, then as needed daily for pain, Disp: 30 tablet, Rfl: 1 .  predniSONE (DELTASONE) 20 MG tablet, 2 tablets daily for 3 days, 1 tablet daily for 4 days., Disp: 10 tablet, Rfl: 0 .  promethazine (PHENERGAN) 25 MG tablet, Take 1 tablet (25 mg total) by mouth every 6 (six) hours as needed for nausea or vomiting (can cause fatigue). Max: 4 tablets per day, Disp: 30 tablet, Rfl: 0 .  SUMAtriptan (IMITREX) 100 MG tablet, Take 1 tablet (100 mg total) by mouth once as needed for migraine. May repeat in 2 hours if headache persists or recurs., Disp: 30 tablet, Rfl: 2   Past Medical History:  Diagnosis Date  . Allergy   . Anemia   . Migraine   . Vitamin D deficiency     No Known Allergies    Observations/Objective:  General : Well sounding patient in no apparent distress HEENT: no hoarseness, no cough for duration of visit Lungs: speaks in complete sentences, no audible wheezing, no apparent distress Neurological: alert, oriented  x 3 Psychiatric: pleasant, judgement appropriate    Assessment and Plan:  Margaret Bartlett was seen today for telehealth consent.  Diagnoses and all orders for this visit:  Acute non-recurrent sinusitis, unspecified location Discussed the importance of avoiding unnecessary antibiotic therapy. Suggested symptomatic OTC remedies. Nasal saline spray for congestion. Nasal steroids, allergy pill, oral steroids offered - start with just prednisone x 2-3 days; initiate abx only if symptoms  persistent/progressive at 10 days Follow up as needed. -     predniSONE (DELTASONE) 20 MG tablet; 2 tablets daily for 3 days, 1 tablet daily for 4 days. -     amoxicillin-clavulanate (AUGMENTIN) 875-125 MG tablet; Take 1 tablet by mouth 2 (two) times daily for 14 days.  Other orders -     benzonatate (TESSALON) 200 MG capsule; Take 1 capsule (200 mg total) by mouth 3 (three) times daily as needed for cough (Max: 600mg  per day).    Follow Up Instructions:    I discussed the assessment and treatment plan with the patient. The patient was provided an opportunity to ask questions and all were answered. The patient agreed with the plan and demonstrated an understanding of the instructions.   The patient was advised to call back or seek an in-person evaluation if the symptoms worsen or if the condition fails to improve as anticipated.  I provided 15 minutes of non-face-to-face time during this encounter.   Dan MakerAshley C Andreika Vandagriff, NP

## 2019-04-16 ENCOUNTER — Encounter: Payer: Self-pay | Admitting: Adult Health

## 2019-09-01 NOTE — Progress Notes (Deleted)
Complete Physical  Assessment and Plan: Thyroid nodule - TSH  H/O keloid of skin Avoid procedures.   Other migraine without status migrainosus, not intractable - continue baclofen PRN   Vitamin D deficiency - Vit D  25 hydroxy (rtn osteoporosis monitoring)  Anemia, unspecified anemia type - Iron and TIBC - Ferritin  Allergy, subsequent encounter Continue medications  Screening cholesterol level - Lipid panel  Screening for blood or protein in urine - Urinalysis, Routine w reflex microscopic (not at [facility]) - Microalbumin / creatinine urine ratio   Medication management - CBC with Differential/Platelet - BASIC METABOLIC PANEL WITH GFR - Hepatic function panel - Magnesium   Routine general medical examination at a health care facility Get [facility], call about cologuard/need colonoscopy but have high deductible.   Hot flashes Follow up GYN  Discussed med's effects and SE's. Screening labs and tests as requested with regular follow-up as recommended.  HPI  This very nice 58 y.o.female presents for complete physical.  Patient has no major health issues.    She does workout. No CP, SOB.  BMI is There is no height or weight on file to calculate BMI., she is working on diet and exercise. Wt Readings from Last 3 Encounters:  09/03/18 140 lb (63.5 kg)  01/28/18 140 lb 9.6 oz (63.8 kg)  02/27/17 138 lb 6.4 oz (62.8 kg)    Lab Results  Component Value Date   CHOL 193 09/03/2018   HDL 94 09/03/2018   LDLCALC 88 09/03/2018   TRIG 39 09/03/2018   CHOLHDL 2.1 09/03/2018   Finally, patient has history of Vitamin D Deficiency.  Currently NOT on supplementation Lab Results  Component Value Date   VD25OH 40 09/03/2018   She has a history of anemia Lab Results  Component Value Date   IRON 98 02/27/2017   TIBC 378 02/27/2017   FERRITIN 32 02/27/2017   She has a history of migraines with aura, she gets them very infrequently, will take excedrin PM which will help, she has  been taking baclofen PRN for sleep.   She follows with her OB/GYN. She is on vivelle-dot and the cream as needed, she is not on bASA.  Has thyroid nodules, recent normal [place], will continue to monitor labs and palpation.   She is Engineer, structural, she is an IT consultant.   Current Medications:  Current Outpatient Medications on File Prior to Visit  Medication Sig Dispense Refill  . azelastine (ASTELIN) 0.1 % nasal spray Place 2 sprays into both nostrils 2 (two) times daily. Use in each nostril as directed 30 mL 2  . baclofen (LIORESAL) 10 MG tablet TAKE 1 TABLET BY MOUTH EVERY DAY 30 tablet 1  . benzonatate (TESSALON) 200 MG capsule Take 1 capsule (200 mg total) by mouth 3 (three) times daily as needed for cough (Max: 600mg  per day). 30 capsule 0  . cyclobenzaprine (FLEXERIL) 10 MG tablet Take 1 tablet (10 mg total) by mouth at bedtime as needed for muscle spasms. 30 tablet 0  . estradiol (ESTRACE) 0.1 MG/GM vaginal cream     . estradiol (VIVELLE-DOT) 0.075 MG/24HR APPLY AND CHANGE 1 PATCH ONTO THE SKIN 2 (TWO) TIMES A WEEK. 8 patch 11  . fluticasone (FLONASE) 50 MCG/ACT nasal spray Place 1 spray into both nostrils daily.    Marland Kitchen levocetirizine (XYZAL) 5 MG tablet Take 1 tablet (5 mg total) by mouth every evening. 90 tablet 3  . meclizine (ANTIVERT) 25 MG tablet 1/2-1 pill up to 3 times daily  for vertigo/nausea 30 tablet 0  . meloxicam (MOBIC) 15 MG tablet Take one daily with food for 2 weeks, can take with tylenol, can not take with aleve, iburpofen, then as needed daily for pain 30 tablet 1  . predniSONE (DELTASONE) 20 MG tablet 2 tablets daily for 3 days, 1 tablet daily for 4 days. 10 tablet 0  . promethazine (PHENERGAN) 25 MG tablet Take 1 tablet (25 mg total) by mouth every 6 (six) hours as needed for nausea or vomiting (can cause fatigue). Max: 4 tablets per day 30 tablet 0  . SUMAtriptan (IMITREX) 100 MG tablet Take 1 tablet (100 mg total) by mouth once as needed for migraine. May  repeat in 2 hours if headache persists or recurs. 30 tablet 2   No current facility-administered medications on file prior to visit.    Health Maintenance:   Immunization History  Administered Date(s) Administered  . Influenza Inj Mdck Quad With Preservative 09/03/2018  . Influenza Split 10/03/2013  . Tdap 02/27/2017   LMP: No LMP recorded. Patient has had a hysterectomy. Sexually Active: yes STD testing offered, declines Pap: 1 year ago with GYN has had 1 abnormal in the past MGM: 06/2015, very dense breast, get 3D Colonoscopy: Dr. Kinnie ScalesMedoff, 2011, OVER DUE Last Dental Exam: Dr. Leanord AsalFarless Last Eye Exam: Dr. Veneta PentonLinsey/Dr. ellington CT head 01/2015  She is divorced, has one [id number] year old daughter who is a doctor, in her internship in [place], has 59 year old grandson, Tamika, and 217 month old granddaughter naomi.   Medical History:  Past Medical History:  Diagnosis Date  . Allergy   . Anemia   . Migraine   . Vitamin D deficiency    Allergies No Known Allergies  SURGICAL HISTORY She  has a past surgical history that includes Abdominal hysterectomy (1991). FAMILY HISTORY Her family history includes Brain cancer in her mother; Depression in her father; Mental illness in her father; Stroke in her father. SOCIAL HISTORY She  reports that she has never smoked. She has never used smokeless tobacco. She reports current alcohol use. She reports that she does not use drugs.  Review of Systems: Review of Systems  Constitutional: Negative.   HENT: Negative for congestion, ear discharge, ear pain, hearing loss, nosebleeds, sore throat and tinnitus.   Eyes: Negative.        Improved  Respiratory: Negative.  Negative for stridor.   Cardiovascular: Negative.   Gastrointestinal: Negative for constipation.  Genitourinary: Negative for dysuria, flank pain, frequency, hematuria and urgency.  Musculoskeletal: Negative for back pain, falls, joint pain, myalgias and neck pain.  Skin: Negative.    Neurological: Negative for dizziness, tingling, tremors, sensory change, speech change, focal weakness, seizures, loss of consciousness and headaches.  Endo/Heme/Allergies: Negative.   Psychiatric/Behavioral: Negative.     Physical Exam: Estimated body mass index is 20.09 kg/m as calculated from the following:   Height as of 09/03/18: 5\' 10"  (1.778 m).   Weight as of 09/03/18: 140 lb (63.5 kg). There were no vitals taken for this visit. General Appearance: Well nourished, in no apparent distress.  Eyes: PERRLA, EOMs, conjunctiva no swelling or erythema, normal fundi and vessels.  Sinuses: No Frontal/maxillary tenderness  ENT/Mouth: Ext aud canals clear, normal light reflex with TMs without erythema, bulging. Good dentition. No erythema, swelling, or exudate on post pharynx. Tonsils not swollen or erythematous. Hearing normal.  Neck: Supple, thyroid enlarged with nodules. No bruits  Respiratory: Respiratory effort normal, BS equal bilaterally without rales, rhonchi, wheezing  or stridor.  Cardio: RRR without murmurs, rubs or gallops. Brisk peripheral pulses without edema.  Chest: symmetric, with normal excursions and percussion.  Breasts: defer  Abdomen: Soft, nontender, no guarding, rebound, hernias, masses, or organomegaly.  Lymphatics: Non tender without lymphadenopathy.  Genitourinary: defer Musculoskeletal: Full ROM all peripheral extremities,5/5 strength, and normal gait.  Skin: Warm, dry without rashes, lesions, ecchymosis. Neuro: Cranial nerves intact, reflexes equal bilaterally. Normal muscle tone, no cerebellar symptoms. Sensation intact.  Psych: Awake and oriented X 3, normal affect, Insight and Judgment appropriate.   EKG: WNL  Quentin Mulling 2:25 PM [facility]

## 2019-09-04 ENCOUNTER — Encounter: Payer: Self-pay | Admitting: Physician Assistant

## 2019-11-12 DIAGNOSIS — Z79890 Hormone replacement therapy (postmenopausal): Secondary | ICD-10-CM | POA: Diagnosis not present

## 2019-11-12 DIAGNOSIS — Z124 Encounter for screening for malignant neoplasm of cervix: Secondary | ICD-10-CM | POA: Diagnosis not present

## 2019-11-12 DIAGNOSIS — Z01419 Encounter for gynecological examination (general) (routine) without abnormal findings: Secondary | ICD-10-CM | POA: Diagnosis not present

## 2019-11-12 DIAGNOSIS — M62838 Other muscle spasm: Secondary | ICD-10-CM | POA: Diagnosis not present

## 2019-12-02 ENCOUNTER — Encounter: Payer: BC Managed Care – PPO | Admitting: Physician Assistant

## 2019-12-08 NOTE — Progress Notes (Signed)
Complete Physical  Assessment and Plan: Thyroid nodule - TSH - goiter is stable  H/O keloid of skin Avoid procedures.   Other migraine without status migrainosus, not intractable - continue baclofen PRN   Vitamin D deficiency - Vit D  25 hydroxy (rtn osteoporosis monitoring)  Anemia, unspecified anemia type - Iron and TIBC - Ferritin  Allergy, subsequent encounter Continue medications  Screening cholesterol level - Lipid panel  Screening for blood or protein in urine - Urinalysis, Routine w reflex microscopic (not at [facility]) - Microalbumin / creatinine urine ratio   Medication management - CBC with Differential/Platelet - BASIC METABOLIC PANEL WITH GFR - Hepatic function panel - Magnesium   Routine general medical examination at a health care facility Get [facility], repeat cologuard 2023  Hot flashes Follow up GYN, can try 10 mg paxil  Discussed med's effects and SE's. Screening labs and tests as requested with regular follow-up as recommended.  HPI  This very nice 59 y.o. AAF presents for complete physical.   She is being following by her GYN for hot flashes. She is on vivelle-dot and the cream as needed, she is not on bASA. She was started on osphena x 3 weeks. States hot flashes are not lasting as long and less frequent over last week.  She works from home, She is Engineer, structural, she is an IT consultant.   Has thyroid nodules, recent normal [place], will continue to monitor labs, no symptoms at this time.  Lab Results  Component Value Date   TSH 1.90 09/03/2018   BMI is Body mass index is 19.65 kg/m., she is working on diet and exercise. Work has been stressful. Wt Readings from Last 3 Encounters:  12/09/19 135 lb (61.2 kg)  09/03/18 140 lb (63.5 kg)  01/28/18 140 lb 9.6 oz (63.8 kg)   She had a negative cologuard 2020 She does not workout but she is active walking up and down her stairs at home due to working on 2nd floor but she is more active int he  summer.. No CP, SOB.  Lab Results  Component Value Date   CHOL 193 09/03/2018   HDL 94 09/03/2018   LDLCALC 88 09/03/2018   TRIG 39 09/03/2018   CHOLHDL 2.1 09/03/2018   Finally, patient has history of Vitamin D Deficiency.  She has been on 5000 IU daily.  Lab Results  Component Value Date   VD25OH 40 09/03/2018   She has a history of anemia Lab Results  Component Value Date   IRON 98 02/27/2017   TIBC 378 02/27/2017   FERRITIN 32 02/27/2017   She has a history of migraines with aura, she gets them very infrequently, will take excedrin PM which will help, she has been taking baclofen PRN for sleep.   Current Medications:   Current Outpatient Medications (Endocrine & Metabolic):  .  estradiol (VIVELLE-DOT) 0.075 MG/24HR, APPLY AND CHANGE 1 PATCH ONTO THE SKIN 2 (TWO) TIMES A WEEK. .  OSPHENA 60 MG TABS, Take 1 tablet by mouth daily. .  predniSONE (DELTASONE) 20 MG tablet, 2 tablets daily for 3 days, 1 tablet daily for 4 days.   Current Outpatient Medications (Respiratory):  .  fluticasone (FLONASE) 50 MCG/ACT nasal spray, Place 1 spray into both nostrils daily. Marland Kitchen  azelastine (ASTELIN) 0.1 % nasal spray, Place 2 sprays into both nostrils 2 (two) times daily. Use in each nostril as directed .  benzonatate (TESSALON) 200 MG capsule, Take 1 capsule (200 mg total) by mouth 3 (three)  times daily as needed for cough (Max: 600mg  per day). (Patient not taking: Reported on 12/09/2019) .  levocetirizine (XYZAL) 5 MG tablet, Take 1 tablet (5 mg total) by mouth every evening.  Current Outpatient Medications (Analgesics):  .  meloxicam (MOBIC) 15 MG tablet, Take one daily with food for 2 weeks, can take with tylenol, can not take with aleve, iburpofen, then as needed daily for pain .  SUMAtriptan (IMITREX) 100 MG tablet, Take 1 tablet (100 mg total) by mouth once as needed for migraine. May repeat in 2 hours if headache persists or recurs.   Current Outpatient Medications (Other):  .   baclofen (LIORESAL) 10 MG tablet, TAKE 1 TABLET BY MOUTH EVERY DAY .  cyclobenzaprine (FLEXERIL) 10 MG tablet, Take 1 tablet (10 mg total) by mouth at bedtime as needed for muscle spasms. 12/11/2019  estradiol (ESTRACE) 0.1 MG/GM vaginal cream,  .  meclizine (ANTIVERT) 25 MG tablet, 1/2-1 pill up to 3 times daily for vertigo/nausea  Health Maintenance:   Immunization History  Administered Date(s) Administered  . Influenza Inj Mdck Quad Pf 11/13/2019  . Influenza Inj Mdck Quad With Preservative 09/03/2018  . Influenza Split 10/03/2013  . Tdap 02/27/2017   LMP: No LMP recorded. Patient has had a hysterectomy. Sexually Active: yes STD testing offered, declines Pap: 1 year ago with GYN has had 1 abnormal in the past MGM: 1//29/2019 OVERDUE very dense breast, get 3D Cologuard 01/2019 negative Last Dental Exam: Dr. 02/2019 Last Eye Exam: Dr. Leanord Asal. ellington CT head 01/2015  She is divorced, has one 46 year old daughter who is a doctor running [employer] unit in [place] has 30 year old grandson, Tamika, and 44.41 year old granddaughter naomi.   Medical History:  Past Medical History:  Diagnosis Date  . Allergy   . Anemia   . Migraine   . Vitamin D deficiency    Allergies No Known Allergies  SURGICAL HISTORY She  has a past surgical history that includes Abdominal hysterectomy (1991). FAMILY HISTORY Her family history includes Brain cancer in her mother; Depression in her father; Mental illness in her father; Stroke in her father. SOCIAL HISTORY She  reports that she has never smoked. She has never used smokeless tobacco. She reports current alcohol use. She reports that she does not use drugs.  Review of Systems: Review of Systems  Constitutional: Negative.   HENT: Negative for congestion, ear discharge, ear pain, hearing loss, nosebleeds, sore throat and tinnitus.   Eyes: Negative.        Improved  Respiratory: Negative.  Negative for stridor.   Cardiovascular: Negative.    Gastrointestinal: Negative for constipation.  Genitourinary: Negative for dysuria, flank pain, frequency, hematuria and urgency.  Musculoskeletal: Negative for back pain, falls, joint pain, myalgias and neck pain.  Skin: Negative.   Neurological: Negative for dizziness, tingling, tremors, sensory change, speech change, focal weakness, seizures, loss of consciousness and headaches.  Endo/Heme/Allergies: Negative.   Psychiatric/Behavioral: Negative.     Physical Exam: Estimated body mass index is 19.65 kg/m as calculated from the following:   Height as of this encounter: 5' 9.5" (1.765 m).   Weight as of this encounter: 135 lb (61.2 kg). BP 118/70   Pulse 67   Temp 97.6 F (36.4 C)   Ht 5' 9.5" (1.765 m)   Wt 135 lb (61.2 kg)   SpO2 98%   BMI 19.65 kg/m  General Appearance: Well nourished, in no apparent distress.  Eyes: PERRLA, EOMs, conjunctiva no swelling or  erythema, normal fundi and vessels.  Sinuses: No Frontal/maxillary tenderness  ENT/Mouth: Ext aud canals clear, normal light reflex with TMs without erythema, bulging. Good dentition. No erythema, swelling, or exudate on post pharynx. Tonsils not swollen or erythematous. Hearing normal.  Neck: Supple, thyroid enlarged with nodules. No bruits  Respiratory: Respiratory effort normal, BS equal bilaterally without rales, rhonchi, wheezing or stridor.  Cardio: RRR without murmurs, rubs or gallops. Brisk peripheral pulses without edema.  Chest: symmetric, with normal excursions and percussion.  Breasts: defer  Abdomen: Soft, nontender, no guarding, rebound, hernias, masses, or organomegaly.  Lymphatics: Non tender without lymphadenopathy.  Genitourinary: defer Musculoskeletal: Full ROM all peripheral extremities,5/5 strength, and normal gait.  Skin: Warm, dry without rashes, lesions, ecchymosis. Neuro: Cranial nerves intact, reflexes equal bilaterally. Normal muscle tone, no cerebellar symptoms. Sensation intact.  Psych: Awake  and oriented X 3, normal affect, Insight and Judgment appropriate.   EKG: defer, will get next year  Quentin Mulling 2:09 PM [facility]

## 2019-12-09 ENCOUNTER — Other Ambulatory Visit: Payer: Self-pay

## 2019-12-09 ENCOUNTER — Ambulatory Visit (INDEPENDENT_AMBULATORY_CARE_PROVIDER_SITE_OTHER): Payer: BC Managed Care – PPO | Admitting: Physician Assistant

## 2019-12-09 ENCOUNTER — Encounter: Payer: Self-pay | Admitting: Physician Assistant

## 2019-12-09 VITALS — BP 118/70 | HR 67 | Temp 97.6°F | Ht 69.5 in | Wt 135.0 lb

## 2019-12-09 DIAGNOSIS — Z13220 Encounter for screening for lipoid disorders: Secondary | ICD-10-CM

## 2019-12-09 DIAGNOSIS — E559 Vitamin D deficiency, unspecified: Secondary | ICD-10-CM

## 2019-12-09 DIAGNOSIS — G43809 Other migraine, not intractable, without status migrainosus: Secondary | ICD-10-CM

## 2019-12-09 DIAGNOSIS — Z872 Personal history of diseases of the skin and subcutaneous tissue: Secondary | ICD-10-CM

## 2019-12-09 DIAGNOSIS — Z0000 Encounter for general adult medical examination without abnormal findings: Secondary | ICD-10-CM | POA: Diagnosis not present

## 2019-12-09 DIAGNOSIS — Z1329 Encounter for screening for other suspected endocrine disorder: Secondary | ICD-10-CM | POA: Diagnosis not present

## 2019-12-09 DIAGNOSIS — Z79899 Other long term (current) drug therapy: Secondary | ICD-10-CM | POA: Diagnosis not present

## 2019-12-09 DIAGNOSIS — Z1389 Encounter for screening for other disorder: Secondary | ICD-10-CM | POA: Diagnosis not present

## 2019-12-09 DIAGNOSIS — Z130 Encounter for screening for diseases of the blood and blood-forming organs and certain disorders involving the immune mechanism: Secondary | ICD-10-CM

## 2019-12-09 DIAGNOSIS — D649 Anemia, unspecified: Secondary | ICD-10-CM

## 2019-12-09 DIAGNOSIS — T7840XD Allergy, unspecified, subsequent encounter: Secondary | ICD-10-CM

## 2019-12-09 DIAGNOSIS — Z0001 Encounter for general adult medical examination with abnormal findings: Secondary | ICD-10-CM

## 2019-12-09 DIAGNOSIS — E042 Nontoxic multinodular goiter: Secondary | ICD-10-CM

## 2019-12-09 NOTE — Patient Instructions (Signed)

## 2019-12-10 DIAGNOSIS — Z20828 Contact with and (suspected) exposure to other viral communicable diseases: Secondary | ICD-10-CM | POA: Diagnosis not present

## 2019-12-10 LAB — CBC WITH DIFFERENTIAL/PLATELET
Absolute Monocytes: 361 cells/uL (ref 200–950)
Basophils Absolute: 50 cells/uL (ref 0–200)
Basophils Relative: 1.2 %
Eosinophils Absolute: 59 cells/uL (ref 15–500)
Eosinophils Relative: 1.4 %
HCT: 36.5 % (ref 35.0–45.0)
Hemoglobin: 12.3 g/dL (ref 11.7–15.5)
Lymphs Abs: 2415 cells/uL (ref 850–3900)
MCH: 31.1 pg (ref 27.0–33.0)
MCHC: 33.7 g/dL (ref 32.0–36.0)
MCV: 92.2 fL (ref 80.0–100.0)
MPV: 10.3 fL (ref 7.5–12.5)
Monocytes Relative: 8.6 %
Neutro Abs: 1315 cells/uL — ABNORMAL LOW (ref 1500–7800)
Neutrophils Relative %: 31.3 %
Platelets: 246 10*3/uL (ref 140–400)
RBC: 3.96 10*6/uL (ref 3.80–5.10)
RDW: 11.7 % (ref 11.0–15.0)
Total Lymphocyte: 57.5 %
WBC: 4.2 10*3/uL (ref 3.8–10.8)

## 2019-12-10 LAB — LIPID PANEL
Cholesterol: 182 mg/dL (ref <200)
HDL: 84 mg/dL (ref >=50)
LDL Cholesterol (Calc): 84 mg/dL (calc)
Non-HDL Cholesterol (Calc): 98 mg/dL (calc) (ref <130)
Total CHOL/HDL Ratio: 2.2 (calc) (ref <5.0)
Triglycerides: 48 mg/dL (ref <150)

## 2019-12-10 LAB — IRON, TOTAL/TOTAL IRON BINDING CAP
%SAT: 26 % (calc) (ref 16–45)
Iron: 100 ug/dL (ref 45–160)
TIBC: 384 mcg/dL (calc) (ref 250–450)

## 2019-12-10 LAB — COMPLETE METABOLIC PANEL WITH GFR
AG Ratio: 1.4 (calc) (ref 1.0–2.5)
ALT: 12 U/L (ref 6–29)
AST: 19 U/L (ref 10–35)
Albumin: 4.6 g/dL (ref 3.6–5.1)
Alkaline phosphatase (APISO): 69 U/L (ref 37–153)
BUN: 11 mg/dL (ref 7–25)
CO2: 32 mmol/L (ref 20–32)
Calcium: 10.1 mg/dL (ref 8.6–10.4)
Chloride: 102 mmol/L (ref 98–110)
Creat: 0.91 mg/dL (ref 0.50–1.05)
GFR, Est African American: 80 mL/min/{1.73_m2} (ref >=60)
GFR, Est Non African American: 69 mL/min/{1.73_m2} (ref >=60)
Globulin: 3.2 g/dL (calc) (ref 1.9–3.7)
Glucose, Bld: 82 mg/dL (ref 65–99)
Potassium: 4.3 mmol/L (ref 3.5–5.3)
Sodium: 140 mmol/L (ref 135–146)
Total Bilirubin: 0.3 mg/dL (ref 0.2–1.2)
Total Protein: 7.8 g/dL (ref 6.1–8.1)

## 2019-12-10 LAB — URINALYSIS, ROUTINE W REFLEX MICROSCOPIC
Bilirubin Urine: NEGATIVE
Glucose, UA: NEGATIVE
Hgb urine dipstick: NEGATIVE
Ketones, ur: NEGATIVE
Leukocytes,Ua: NEGATIVE
Nitrite: NEGATIVE
Protein, ur: NEGATIVE
Specific Gravity, Urine: 1.013 (ref 1.001–1.03)
pH: 8 (ref 5.0–8.0)

## 2019-12-10 LAB — VITAMIN D 25 HYDROXY (VIT D DEFICIENCY, FRACTURES): Vit D, 25-Hydroxy: 71 ng/mL (ref 30–100)

## 2019-12-10 LAB — FERRITIN: Ferritin: 33 ng/mL (ref 16–232)

## 2019-12-10 LAB — MICROALBUMIN / CREATININE URINE RATIO
Creatinine, Urine: 57 mg/dL (ref 20–275)
Microalb Creat Ratio: 9 mcg/mg creat (ref <30)
Microalb, Ur: 0.5 mg/dL

## 2019-12-10 LAB — MAGNESIUM: Magnesium: 2 mg/dL (ref 1.5–2.5)

## 2019-12-10 LAB — TSH: TSH: 2.1 mIU/L (ref 0.40–4.50)

## 2019-12-15 ENCOUNTER — Other Ambulatory Visit: Payer: Self-pay | Admitting: Physician Assistant

## 2019-12-15 MED ORDER — PREDNISONE 20 MG PO TABS: ORAL_TABLET | ORAL | 0 refills | Status: DC

## 2019-12-15 MED ORDER — AZITHROMYCIN 250 MG PO TABS: ORAL_TABLET | ORAL | 1 refills | Status: AC

## 2019-12-31 NOTE — Progress Notes (Signed)
Assessment and Plan:  Frankye was seen today for arm pain.  Diagnoses and all orders for this visit:  Acute neck pain Discussed medications and side effects Continue to monitor symptoms Discussed also using ice to left trapezius May also rotate heat and massage area Discussed posture while sitting in front of computer. -     meloxicam (MOBIC) 15 MG tablet; Take one daily with food for 2 weeks, can take with tylenol, can not take with aleve, iburpofen, then as needed daily for pain -     cyclobenzaprine (FLEXERIL) 10 MG tablet; Take 1 tablet (10 mg total) by mouth at bedtime as needed for muscle spasms.  Neuralgia of left upper extremity Discussed like etiology related to neck pain inflammation Monitor symptoms Likely resolve once inflmatory source improved.  Discussed hospital precautions.  Contact office with new or worsening symptoms.  May benefit from trigger point injection.    Further disposition pending results of labs. Discussed med's effects and SE's.   Over 20 minutes of interview, exam, counseling, chart review, and critical decision making was performed.   Future Appointments  Date Time Provider [place]  12/08/2020  2:00 PM Vicie Mutters, PA-C [facility] None    ------------------------------------------------------------------------------------------------------------------   HPI 59 y.o.female presents for L arm pain.  She has been taking acetaminaphin 1,000mg  BID.  Reports arm pain has been going on for one week.  She rates it 8/10 pain.  It is sharp/achy.  Reports the ache is constant and sometime there is a shooting pain.   Tender from shoulder down her arm, spasm in shoulder and triceps it is particularly tender.    She has tried using baclofen 5mg  at night to decrease the pain for sleep.  She reports it mildly helped her to sleep, although she does wake during the night related to the pain.   She is able to use the arm but reports she has been avoiding  it as she is right hand dominant. She reports she has had a similar pain last summer when she was doing lots of mowing.  She has not been doing those activities.  No new activities that she is aware of.  She works from home and sits in front of a computer daily.  Denies any neck pain, headaches, vision change, otalgia, chest pains, palpitations or shortness of breath.    Past Medical History:  Diagnosis Date  . Allergy   . Anemia   . Migraine   . Vitamin D deficiency      No Known Allergies  Current Outpatient Medications on File Prior to Visit  Medication Sig  . baclofen (LIORESAL) 10 MG tablet TAKE 1 TABLET BY MOUTH EVERY DAY  . benzonatate (TESSALON) 200 MG capsule Take 1 capsule (200 mg total) by mouth 3 (three) times daily as needed for cough (Max: 600mg  per day).  Marland Kitchen estradiol (ESTRACE) 0.1 MG/GM vaginal cream   . estradiol (VIVELLE-DOT) 0.075 MG/24HR APPLY AND CHANGE 1 PATCH ONTO THE SKIN 2 (TWO) TIMES A WEEK.  . fluticasone (FLONASE) 50 MCG/ACT nasal spray Place 1 spray into both nostrils daily.  . meclizine (ANTIVERT) 25 MG tablet 1/2-1 pill up to 3 times daily for vertigo/nausea  . OSPHENA 60 MG TABS Take 1 tablet by mouth daily.  . predniSONE (DELTASONE) 20 MG tablet 2 tablets daily for 3 days, 1 tablet daily for 4 days.  Marland Kitchen azelastine (ASTELIN) 0.1 % nasal spray Place 2 sprays into both nostrils 2 (two) times daily. Use in each nostril  as directed  . levocetirizine (XYZAL) 5 MG tablet Take 1 tablet (5 mg total) by mouth every evening.  . SUMAtriptan (IMITREX) 100 MG tablet Take 1 tablet (100 mg total) by mouth once as needed for migraine. May repeat in 2 hours if headache persists or recurs.   No current facility-administered medications on file prior to visit.    ROS: all negative except above.   Physical Exam:  BP 108/64   Pulse 84   Temp (!) 97.2 F (36.2 C)   Resp 16   Ht 5' 9.5" (1.765 m)   Wt 137 lb 14.4 oz (62.6 kg)   BMI 20.07 kg/m   General Appearance:  Well nourished, in no apparent distress. Eyes: PERRLA, EOMs, conjunctiva no swelling or erythema Sinuses: No Frontal/maxillary tenderness ENT/Mouth: Ext aud canals clear, TMs without erythema, bulging. No erythema, swelling, or exudate on post pharynx.  Tonsils not swollen or erythematous. Hearing normal.  Neck: Supple, thyroid normal.  Respiratory: Respiratory effort normal, BS equal bilaterally without rales, rhonchi, wheezing or stridor.  Cardio: RRR with no MRGs. Brisk peripheral pulses without edema.  Abdomen: Soft, + BS.  Non tender, no guarding, rebound, hernias, masses. Lymphatics: Non tender without lymphadenopathy.  Musculoskeletal: Full ROM RUE, LUE decreased Abduction 60%. 5/5 strength RUE, LUE 4/5 increased pain, normal gait. Trapezius muscles asymmetrical. Left trapezes significant bulge, tender to palpation.  Skin: Warm, dry without rashes, lesions, ecchymosis.  Neuro: Cranial nerves intact. Normal muscle tone, no cerebellar symptoms. Sensation intact.  Psych: Awake and oriented X 3, normal affect, Insight and Judgment appropriate.      Elder Negus, NP 1:10 PM [facility]

## 2020-01-01 ENCOUNTER — Other Ambulatory Visit: Payer: Self-pay

## 2020-01-01 ENCOUNTER — Ambulatory Visit (INDEPENDENT_AMBULATORY_CARE_PROVIDER_SITE_OTHER): Payer: BC Managed Care – PPO | Admitting: Adult Health Nurse Practitioner

## 2020-01-01 VITALS — BP 108/64 | HR 84 | Temp 97.2°F | Resp 16 | Ht 69.5 in | Wt 137.9 lb

## 2020-01-01 DIAGNOSIS — M542 Cervicalgia: Secondary | ICD-10-CM

## 2020-01-01 DIAGNOSIS — M792 Neuralgia and neuritis, unspecified: Secondary | ICD-10-CM

## 2020-01-01 MED ORDER — CYCLOBENZAPRINE HCL 10 MG PO TABS: 10.0000 mg | ORAL_TABLET | Freq: Every evening | ORAL | 0 refills | Status: DC | PRN

## 2020-01-01 MED ORDER — MELOXICAM 15 MG PO TABS: ORAL_TABLET | ORAL | 1 refills | Status: DC

## 2020-01-01 NOTE — Patient Instructions (Signed)
We will send in Meloxicam 15mg  take one tablet daily for two weeks.  Take with food.  Ice the area at a time.   We will send in muscle relaxer for you to take at bedtime.

## 2020-01-12 ENCOUNTER — Encounter: Payer: Self-pay | Admitting: Adult Health Nurse Practitioner

## 2020-02-14 ENCOUNTER — Other Ambulatory Visit: Payer: Self-pay | Admitting: Adult Health Nurse Practitioner

## 2020-02-14 DIAGNOSIS — M542 Cervicalgia: Secondary | ICD-10-CM

## 2020-02-18 DIAGNOSIS — Z20828 Contact with and (suspected) exposure to other viral communicable diseases: Secondary | ICD-10-CM | POA: Diagnosis not present

## 2020-06-14 DIAGNOSIS — Z03818 Encounter for observation for suspected exposure to other biological agents ruled out: Secondary | ICD-10-CM | POA: Diagnosis not present

## 2020-06-14 DIAGNOSIS — Z20822 Contact with and (suspected) exposure to covid-19: Secondary | ICD-10-CM | POA: Diagnosis not present

## 2020-06-30 DIAGNOSIS — Z20822 Contact with and (suspected) exposure to covid-19: Secondary | ICD-10-CM | POA: Diagnosis not present

## 2020-08-16 ENCOUNTER — Other Ambulatory Visit: Payer: Self-pay | Admitting: Internal Medicine

## 2020-08-16 DIAGNOSIS — M542 Cervicalgia: Secondary | ICD-10-CM

## 2020-09-08 ENCOUNTER — Encounter: Payer: BLUE CROSS/BLUE SHIELD | Admitting: Physician Assistant

## 2020-09-09 DIAGNOSIS — Z1231 Encounter for screening mammogram for malignant neoplasm of breast: Secondary | ICD-10-CM | POA: Diagnosis not present

## 2020-12-08 ENCOUNTER — Encounter: Payer: BC Managed Care – PPO | Admitting: Adult Health Nurse Practitioner

## 2020-12-09 ENCOUNTER — Encounter: Payer: BC Managed Care – PPO | Admitting: Adult Health Nurse Practitioner

## 2021-01-06 ENCOUNTER — Other Ambulatory Visit: Payer: Self-pay

## 2021-01-06 ENCOUNTER — Ambulatory Visit (INDEPENDENT_AMBULATORY_CARE_PROVIDER_SITE_OTHER): Payer: BC Managed Care – PPO | Admitting: Adult Health Nurse Practitioner

## 2021-01-06 ENCOUNTER — Encounter: Payer: Self-pay | Admitting: Adult Health Nurse Practitioner

## 2021-01-06 VITALS — BP 120/74 | HR 83 | Temp 97.7°F | Ht 71.0 in | Wt 139.0 lb

## 2021-01-06 DIAGNOSIS — E042 Nontoxic multinodular goiter: Secondary | ICD-10-CM

## 2021-01-06 DIAGNOSIS — Z1329 Encounter for screening for other suspected endocrine disorder: Secondary | ICD-10-CM

## 2021-01-06 DIAGNOSIS — Z130 Encounter for screening for diseases of the blood and blood-forming organs and certain disorders involving the immune mechanism: Secondary | ICD-10-CM | POA: Diagnosis not present

## 2021-01-06 DIAGNOSIS — G43809 Other migraine, not intractable, without status migrainosus: Secondary | ICD-10-CM

## 2021-01-06 DIAGNOSIS — Z1389 Encounter for screening for other disorder: Secondary | ICD-10-CM

## 2021-01-06 DIAGNOSIS — I10 Essential (primary) hypertension: Secondary | ICD-10-CM

## 2021-01-06 DIAGNOSIS — Z79899 Other long term (current) drug therapy: Secondary | ICD-10-CM

## 2021-01-06 DIAGNOSIS — T7840XD Allergy, unspecified, subsequent encounter: Secondary | ICD-10-CM

## 2021-01-06 DIAGNOSIS — Z872 Personal history of diseases of the skin and subcutaneous tissue: Secondary | ICD-10-CM

## 2021-01-06 DIAGNOSIS — Z1321 Encounter for screening for nutritional disorder: Secondary | ICD-10-CM

## 2021-01-06 DIAGNOSIS — D649 Anemia, unspecified: Secondary | ICD-10-CM

## 2021-01-06 DIAGNOSIS — E559 Vitamin D deficiency, unspecified: Secondary | ICD-10-CM | POA: Diagnosis not present

## 2021-01-06 DIAGNOSIS — Z136 Encounter for screening for cardiovascular disorders: Secondary | ICD-10-CM | POA: Diagnosis not present

## 2021-01-06 DIAGNOSIS — M792 Neuralgia and neuritis, unspecified: Secondary | ICD-10-CM

## 2021-01-06 DIAGNOSIS — Z13220 Encounter for screening for lipoid disorders: Secondary | ICD-10-CM

## 2021-01-06 DIAGNOSIS — M542 Cervicalgia: Secondary | ICD-10-CM

## 2021-01-06 DIAGNOSIS — Z0000 Encounter for general adult medical examination without abnormal findings: Secondary | ICD-10-CM

## 2021-01-06 DIAGNOSIS — Z131 Encounter for screening for diabetes mellitus: Secondary | ICD-10-CM

## 2021-01-06 DIAGNOSIS — R42 Dizziness and giddiness: Secondary | ICD-10-CM

## 2021-01-06 DIAGNOSIS — Z0001 Encounter for general adult medical examination with abnormal findings: Secondary | ICD-10-CM

## 2021-01-06 NOTE — Progress Notes (Signed)
Complete Physical   Assessment / Plan:   Encounter for routine medical examination w/ abnormal findings Yearly MGM 2022 Repeat Cologuard 2023  Multinodular goiter - TSH - goiter is stable  H/O keloid of skin Avoid procedures.   Other migraine without status migrainosus, not intractable - continue baclofen PRN Has sumatriptan, PRN   Vitamin D deficiency - Vit D  25 hydroxy (rtn osteoporosis monitoring)  Anemia, unspecified anemia type - Iron and TIBC - Ferritin  Allergy, subsequent encounter Continue medications Discussed changing antihistamines  Screening cholesterol level - Lipid panel  Screening for blood or protein in urine - Urinalysis, Routine w reflex microscopic (not at [facility]) - Microalbumin / creatinine urine ratio   Medication management - CBC with Differential/Platelet - BASIC METABOLIC PANEL WITH GFR - Hepatic function panel - Magnesium  Hot flashes Follow up GYN Taking estradiol patch biweekly, weaning off this Doing well Mild symptoms  Discussed med's effects and SE's. Screening labs and tests as requested with regular follow-up as recommended.  HPI  This very nice 60 y.o. female presents for complete physical.    She reports increase allergy symptoms this week.  She reports her left ear has been bothering her.  She is having feelings of fullness.  She has been taking xyzal and flonase spray daily.  She also uses saline to help with the nasal congestion.  She reports in the morning she wakes [place] congested.  It does improve as the day goes on.  She is being following by her GYN for hot flashes. She is on vivelle-dot and the cream as needed, she has not been using this.  She is not on bASA. She was started on osphena x 3 weeks. States hot flashes are not lasting as long and less frequent over last week.   Pap two years ago,  She works from home, She is Statistician, she is an Barrister's clerk.   Has thyroid nodules, recent normal [place],  will continue to monitor labs, no symptoms at this time.  Lab Results  Component Value Date   TSH 1.23 01/06/2021   BMI is Body mass index is 19.39 kg/m., she is working on diet and exercise. Work has been stressful. Wt Readings from Last 3 Encounters:  01/06/21 139 lb (63 kg)  01/01/20 137 lb 14.4 oz (62.6 kg)  12/09/19 135 lb (61.2 kg)   She had a negative cologuard 2020 She does not workout but she is active walking up and down her stairs at home due to working on 2nd floor but she is more active int he summer.. No CP, SOB.  Lab Results  Component Value Date   CHOL 183 01/06/2021   HDL 85 01/06/2021   LDLCALC 85 01/06/2021   TRIG 52 01/06/2021   CHOLHDL 2.2 01/06/2021   Finally, patient has history of Vitamin D Deficiency.  She has been on 5000 IU daily.  Lab Results  Component Value Date   VD25OH 52 01/06/2021   She has a history of anemia Lab Results  Component Value Date   IRON 98 01/06/2021   TIBC 357 01/06/2021   FERRITIN 33 12/09/2019   She has a history of migraines with aura, she gets them very infrequently, will take excedrin PM which will help, she has been taking baclofen PRN for sleep.   Current Medications:   Current Outpatient Medications (Endocrine & Metabolic):  .  estradiol (VIVELLE-DOT) 0.075 MG/24HR, APPLY AND CHANGE 1 PATCH ONTO THE SKIN 2 (TWO) TIMES A WEEK. Marland Kitchen  OSPHENA 60 MG TABS, Take 1 tablet by mouth daily.   Current Outpatient Medications (Respiratory):  .  fluticasone (FLONASE) 50 MCG/ACT nasal spray, Place 1 spray into both nostrils daily. Marland Kitchen  azelastine (ASTELIN) 0.1 % nasal spray, Place 2 sprays into both nostrils 2 (two) times daily. Use in each nostril as directed .  levocetirizine (XYZAL) 5 MG tablet, Take 1 tablet (5 mg total) by mouth every evening.  Current Outpatient Medications (Analgesics):  .  meloxicam (MOBIC) 15 MG tablet, Take 1/2 to 1 tablet Daily AS NEEDED with Food for Pain & Inflammation .  SUMAtriptan (IMITREX) 100 MG  tablet, Take 1 tablet (100 mg total) by mouth once as needed for migraine. May repeat in 2 hours if headache persists or recurs.   Current Outpatient Medications (Other):  .  baclofen (LIORESAL) 10 MG tablet, TAKE 1 TABLET BY MOUTH EVERY DAY .  cyclobenzaprine (FLEXERIL) 10 MG tablet, Take 1 tablet (10 mg total) by mouth at bedtime as needed for muscle spasms. Marland Kitchen  estradiol (ESTRACE) 0.1 MG/GM vaginal cream,  .  acetic acid-hydrocortisone (VOSOL-HC) OTIC solution, Place 3 drops into the left ear 2 (two) times daily. .  meclizine (ANTIVERT) 25 MG tablet, 1/2-1 pill up to 3 times daily for vertigo/nausea .  meclizine (ANTIVERT) 25 MG tablet, 1/2-1 pill up to 3 times daily for vertigo/nausea  Health Maintenance:   Immunization History  Administered Date(s) Administered  . Influenza Inj Mdck Quad Pf 11/13/2019  . Influenza Inj Mdck Quad With Preservative 09/03/2018  . Influenza Split 10/03/2013  . PFIZER(Purple Top)SARS-COV-2 Vaccination 02/25/2020, 03/20/2020, 10/16/2020  . Tdap 02/27/2017   LMP: No LMP recorded. Patient has had a hysterectomy. Sexually Active: yes STD testing offered, declines Pap: 1 year ago with GYN has had 1 abnormal in the past MGM: 1//29/2019 OVERDUE very dense breast, get 3D Cologuard 01/2019 negative Last Dental Exam: Dr. Leanord Asal Last Eye Exam: Dr. Veneta Penton. [place] CT head 01/2015  She is divorced, has one 32 year old daughter who is a doctor running COVID unit in [place] has 36 year old grandson, Tamika, and 3.42 year old granddaughter Janelle Floor.    Medical History:  Past Medical History:  Diagnosis Date  . Allergy   . Anemia   . Migraine   . Vitamin D deficiency    Allergies No Known Allergies  SURGICAL HISTORY She  has a past surgical history that includes Abdominal hysterectomy (1991). FAMILY HISTORY Her family history includes Brain cancer in her mother; Depression in her father; Mental illness in her father; Stroke in her father. SOCIAL  HISTORY She  reports that she has never smoked. She has never used smokeless tobacco. She reports current alcohol use. She reports that she does not use drugs.   Review of Systems: Review of Systems  Constitutional: Negative.   HENT: Negative for congestion, ear discharge, ear pain, hearing loss, nosebleeds, sore throat and tinnitus.   Eyes: Negative.        Improved  Respiratory: Negative.  Negative for stridor.   Cardiovascular: Negative.   Gastrointestinal: Negative for constipation.  Genitourinary: Negative for dysuria, flank pain, frequency, hematuria and urgency.  Musculoskeletal: Negative for back pain, falls, joint pain, myalgias and neck pain.  Skin: Negative.   Neurological: Negative for dizziness, tingling, tremors, sensory change, speech change, focal weakness, seizures, loss of consciousness and headaches.  Endo/Heme/Allergies: Negative.   Psychiatric/Behavioral: Negative.     Physical Exam: Estimated body mass index is 19.39 kg/m as calculated from the following:  Height as of this encounter: 5\' 11"  (1.803 m).   Weight as of this encounter: 139 lb (63 kg). BP 120/74   Pulse 83   Temp 97.7 F (36.5 C)   Ht 5\' 11"  (1.803 m) Comment: with boots on  Wt 139 lb (63 kg)   SpO2 99%   BMI 19.39 kg/m  General Appearance: Well nourished, in no apparent distress.  Eyes: PERRLA, EOMs, conjunctiva no swelling or erythema, normal fundi and vessels.  Sinuses: No Frontal/maxillary tenderness  ENT/Mouth: Ext aud canals clear, normal light reflex with TMs without erythema, bulging. Good dentition. No erythema, swelling, or exudate on post pharynx. Tonsils not swollen or erythematous. Hearing normal.  Neck: Supple, thyroid enlarged with nodules. No bruits  Respiratory: Respiratory effort normal, BS equal bilaterally without rales, rhonchi, wheezing or stridor.  Cardio: RRR without murmurs, rubs or gallops. Brisk peripheral pulses without edema.  Chest: symmetric, with normal  excursions and percussion.  Breasts: defer  Abdomen: Soft, nontender, no guarding, rebound, hernias, masses, or organomegaly.  Lymphatics: Non tender without lymphadenopathy.  Genitourinary: defer Musculoskeletal: Full ROM all peripheral extremities,5/5 strength, and normal gait.  Skin: Warm, dry without rashes, lesions, ecchymosis. Neuro: Cranial nerves intact, reflexes equal bilaterally. Normal muscle tone, no cerebellar symptoms. Sensation intact.  Psych: Awake and oriented X 3, normal affect, Insight and Judgment appropriate.   EKG:  NSR, no ST changes    , , DNP [facility] 01/06/2021  3:23 PM

## 2021-01-07 ENCOUNTER — Other Ambulatory Visit: Payer: Self-pay | Admitting: Adult Health Nurse Practitioner

## 2021-01-07 DIAGNOSIS — H6690 Otitis media, unspecified, unspecified ear: Secondary | ICD-10-CM

## 2021-01-07 LAB — VITAMIN D 25 HYDROXY (VIT D DEFICIENCY, FRACTURES): Vit D, 25-Hydroxy: 52 ng/mL (ref 30–100)

## 2021-01-07 LAB — URINALYSIS W MICROSCOPIC + REFLEX CULTURE
Bacteria, UA: NONE SEEN /HPF
Bilirubin Urine: NEGATIVE
Glucose, UA: NEGATIVE
Hgb urine dipstick: NEGATIVE
Hyaline Cast: NONE SEEN /LPF
Ketones, ur: NEGATIVE
Leukocyte Esterase: NEGATIVE
Nitrites, Initial: NEGATIVE
Protein, ur: NEGATIVE
RBC / HPF: NONE SEEN /HPF (ref 0–2)
Specific Gravity, Urine: 1.011 (ref 1.001–1.03)
Squamous Epithelial / HPF: NONE SEEN /HPF (ref <=5)
WBC, UA: NONE SEEN /HPF (ref 0–5)
pH: 8 (ref 5.0–8.0)

## 2021-01-07 LAB — COMPLETE METABOLIC PANEL WITH GFR
AG Ratio: 1.2 (calc) (ref 1.0–2.5)
ALT: 11 U/L (ref 6–29)
AST: 17 U/L (ref 10–35)
Albumin: 4.1 g/dL (ref 3.6–5.1)
Alkaline phosphatase (APISO): 64 U/L (ref 37–153)
BUN/Creatinine Ratio: 11 (calc) (ref 6–22)
BUN: 12 mg/dL (ref 7–25)
CO2: 30 mmol/L (ref 20–32)
Calcium: 9.5 mg/dL (ref 8.6–10.4)
Chloride: 105 mmol/L (ref 98–110)
Creat: 1.06 mg/dL — ABNORMAL HIGH (ref 0.50–0.99)
GFR, Est African American: 66 mL/min/{1.73_m2} (ref >=60)
GFR, Est Non African American: 57 mL/min/{1.73_m2} — ABNORMAL LOW (ref >=60)
Globulin: 3.3 g/dL (calc) (ref 1.9–3.7)
Glucose, Bld: 89 mg/dL (ref 65–99)
Potassium: 4.1 mmol/L (ref 3.5–5.3)
Sodium: 140 mmol/L (ref 135–146)
Total Bilirubin: 0.3 mg/dL (ref 0.2–1.2)
Total Protein: 7.4 g/dL (ref 6.1–8.1)

## 2021-01-07 LAB — CBC WITH DIFFERENTIAL/PLATELET
Absolute Monocytes: 340 cells/uL (ref 200–950)
Basophils Absolute: 30 cells/uL (ref 0–200)
Basophils Relative: 0.6 %
Eosinophils Absolute: 80 cells/uL (ref 15–500)
Eosinophils Relative: 1.6 %
HCT: 35.5 % (ref 35.0–45.0)
Hemoglobin: 12 g/dL (ref 11.7–15.5)
Lymphs Abs: 2285 cells/uL (ref 850–3900)
MCH: 31.6 pg (ref 27.0–33.0)
MCHC: 33.8 g/dL (ref 32.0–36.0)
MCV: 93.4 fL (ref 80.0–100.0)
MPV: 10.6 fL (ref 7.5–12.5)
Monocytes Relative: 6.8 %
Neutro Abs: 2265 cells/uL (ref 1500–7800)
Neutrophils Relative %: 45.3 %
Platelets: 226 10*3/uL (ref 140–400)
RBC: 3.8 10*6/uL (ref 3.80–5.10)
RDW: 12 % (ref 11.0–15.0)
Total Lymphocyte: 45.7 %
WBC: 5 10*3/uL (ref 3.8–10.8)

## 2021-01-07 LAB — HEMOGLOBIN A1C
Hgb A1c MFr Bld: 5.3 % of total Hgb (ref <5.7)
Mean Plasma Glucose: 105 mg/dL
eAG (mmol/L): 5.8 mmol/L

## 2021-01-07 LAB — LIPID PANEL
Cholesterol: 183 mg/dL (ref <200)
HDL: 85 mg/dL (ref >=50)
LDL Cholesterol (Calc): 85 mg/dL (calc)
Non-HDL Cholesterol (Calc): 98 mg/dL (calc) (ref <130)
Total CHOL/HDL Ratio: 2.2 (calc) (ref <5.0)
Triglycerides: 52 mg/dL (ref <150)

## 2021-01-07 LAB — NO CULTURE INDICATED

## 2021-01-07 LAB — IRON, TOTAL/TOTAL IRON BINDING CAP
%SAT: 27 % (calc) (ref 16–45)
Iron: 98 ug/dL (ref 45–160)
TIBC: 357 mcg/dL (calc) (ref 250–450)

## 2021-01-07 LAB — TSH: TSH: 1.23 mIU/L (ref 0.40–4.50)

## 2021-01-07 LAB — MAGNESIUM: Magnesium: 1.9 mg/dL (ref 1.5–2.5)

## 2021-01-07 LAB — VITAMIN B12: Vitamin B-12: 1145 pg/mL — ABNORMAL HIGH (ref 200–1100)

## 2021-01-07 MED ORDER — MELOXICAM 15 MG PO TABS: ORAL_TABLET | ORAL | 1 refills | Status: DC

## 2021-01-07 MED ORDER — MECLIZINE HCL 25 MG PO TABS
ORAL_TABLET | ORAL | 0 refills | Status: AC
Start: 2021-01-07 — End: ?

## 2021-01-07 MED ORDER — MECLIZINE HCL 25 MG PO TABS: ORAL_TABLET | ORAL | 0 refills | Status: DC

## 2021-01-07 MED ORDER — HYDROCORTISONE-ACETIC ACID 1-2 % OT SOLN: 3.0000 [drp] | Freq: Two times a day (BID) | OTIC | 1 refills | Status: DC

## 2021-01-14 ENCOUNTER — Encounter: Payer: Self-pay | Admitting: Adult Health Nurse Practitioner

## 2021-01-28 ENCOUNTER — Other Ambulatory Visit: Payer: Self-pay

## 2021-01-28 MED ORDER — BACLOFEN 10 MG PO TABS: 10.0000 mg | ORAL_TABLET | Freq: Every day | ORAL | 1 refills | Status: DC

## 2021-01-28 NOTE — Telephone Encounter (Signed)
REFILL on BACLOFEN

## 2021-03-22 DIAGNOSIS — Z01419 Encounter for gynecological examination (general) (routine) without abnormal findings: Secondary | ICD-10-CM | POA: Diagnosis not present

## 2021-03-22 DIAGNOSIS — Z79890 Hormone replacement therapy (postmenopausal): Secondary | ICD-10-CM | POA: Diagnosis not present

## 2021-03-22 DIAGNOSIS — Z13820 Encounter for screening for osteoporosis: Secondary | ICD-10-CM | POA: Diagnosis not present

## 2021-03-22 DIAGNOSIS — N941 Unspecified dyspareunia: Secondary | ICD-10-CM | POA: Diagnosis not present

## 2021-03-30 ENCOUNTER — Other Ambulatory Visit: Payer: Self-pay | Admitting: Adult Health Nurse Practitioner

## 2021-04-06 ENCOUNTER — Other Ambulatory Visit: Payer: Self-pay | Admitting: Internal Medicine

## 2021-04-06 MED ORDER — DEXAMETHASONE 4 MG PO TABS: ORAL_TABLET | ORAL | 0 refills | Status: DC

## 2021-04-19 ENCOUNTER — Other Ambulatory Visit: Payer: Self-pay | Admitting: Internal Medicine

## 2021-04-19 MED ORDER — DEXAMETHASONE 4 MG PO TABS
ORAL_TABLET | ORAL | 0 refills | Status: DC
Start: 2021-04-19 — End: 2021-04-29

## 2021-04-19 MED ORDER — AZITHROMYCIN 250 MG PO TABS: ORAL_TABLET | ORAL | 1 refills | Status: DC

## 2021-04-29 ENCOUNTER — Ambulatory Visit (INDEPENDENT_AMBULATORY_CARE_PROVIDER_SITE_OTHER): Payer: BC Managed Care – PPO | Admitting: Internal Medicine

## 2021-04-29 ENCOUNTER — Encounter: Payer: Self-pay | Admitting: Internal Medicine

## 2021-04-29 ENCOUNTER — Other Ambulatory Visit: Payer: Self-pay

## 2021-04-29 VITALS — BP 116/76 | HR 78 | Temp 97.7°F | Resp 17 | Ht 70.0 in | Wt 134.7 lb

## 2021-04-29 DIAGNOSIS — J309 Allergic rhinitis, unspecified: Secondary | ICD-10-CM | POA: Diagnosis not present

## 2021-04-29 DIAGNOSIS — U099 Post covid-19 condition, unspecified: Secondary | ICD-10-CM | POA: Diagnosis not present

## 2021-04-29 MED ORDER — DEXAMETHASONE 4 MG PO TABS: ORAL_TABLET | ORAL | 0 refills | Status: DC

## 2021-04-29 NOTE — Progress Notes (Signed)
    Future Appointments  Date Time Provider Department Center  01/09/2022  2:00 PM Elder Negus, NP [facility] None    History of Present Illness:     Patient is a very nice 60 yo DBF with hx/o recent Covid infection testing (+) on 04/06/2021  and with sx's of HA's and sinus congestion /drainage, she was treated with a Z Pak  & a decadron taper about 12 days. She initially improved but now persists with soreness , & puffiness around her eyes. She denies any visual sx's,  fever, chills, rash, dyspnea, cough or dyspnea   Medications     estradiol (VIVELLE-DOT) 0.075 MG/24HR, APPLY AND CHANGE 1 PATCH ONTO THE SKIN 2 (TWO) TIMES A WEEK.   OSPHENA 60 MG TABS, Take 1 tablet by mouth daily.    fluticasone (FLONASE) 50 MCG/ACT nasal spray, Place 1 spray into both nostrils daily.   azelastine (ASTELIN) 0.1 % nasal spray, Place 2 sprays into both nostrils 2  times daily.    levocetirizine (XYZAL) 5 MG tablet, Take 1 tablet  every evening.    meloxicam 15 MG tablet, Take 1/2 to 1 tablet Daily AS NEEDED    SUMAtriptan 100 MG tablet, Take 1 tablet once as needed for migraine. May repeat in 2 hours if headache persists or recurs.    baclofen 10 MG tablet, Take 1 tablet daily as needed.   cyclobenzaprine 10 MG tablet, Take 1 tablet at bedtime as needed for muscle spasms.Daily with    estradiol (ESTRACE) 0.1 MG/GM vaginal cream,   Problem list  She has Allergy; Anemia; Migraine; Vitamin D deficiency; H/O keloid of skin; and Multinodular goiter on their problem list.   Observations/Objective:  BP 116/76 (BP Location: Right Leg, Patient Position: Sitting, Cuff Size: Normal)   Pulse 78   Temp 97.7 F (36.5 C)   Resp 17   Ht 5\' 10"  (1.778 m)   Wt 134 lb 11.2 oz (61.1 kg)   SpO2 99%   BMI 19.33 kg/m   HEENT - W EAC's patent  & TM's Nl. EOM's full & Conjugate. PERRLA. N/O/P clear.  Neck - supple.  Chest - Clear equal BS. Cor - Nl HS. RRR w/o sig MGR. PP 1(+). No edema. MS- FROM w/o  deformities.  Gait Nl. Neuro -  Nl w/o focal abnormalities.   Assessment and Plan:   1. 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 The patient was provided an opportunity to ask questions and all were answered. The patient agreed with the plan and demonstrated an understanding of the instructions.        The patient was advised to call back or seek an in-person evaluation if the symptoms worsen or if the condition fails to improve as anticipated.   11-13-1990, MD

## 2021-05-26 DIAGNOSIS — L659 Nonscarring hair loss, unspecified: Secondary | ICD-10-CM | POA: Diagnosis not present

## 2021-06-22 ENCOUNTER — Other Ambulatory Visit: Payer: Self-pay | Admitting: Adult Health

## 2021-08-12 ENCOUNTER — Other Ambulatory Visit: Payer: Self-pay | Admitting: Internal Medicine

## 2021-08-12 DIAGNOSIS — M542 Cervicalgia: Secondary | ICD-10-CM

## 2021-09-13 DIAGNOSIS — Z780 Asymptomatic menopausal state: Secondary | ICD-10-CM | POA: Diagnosis not present

## 2021-09-13 DIAGNOSIS — Z13820 Encounter for screening for osteoporosis: Secondary | ICD-10-CM | POA: Diagnosis not present

## 2021-09-13 DIAGNOSIS — Z1231 Encounter for screening mammogram for malignant neoplasm of breast: Secondary | ICD-10-CM | POA: Diagnosis not present

## 2021-09-19 ENCOUNTER — Other Ambulatory Visit: Payer: Self-pay | Admitting: Nurse Practitioner

## 2021-11-21 ENCOUNTER — Encounter: Payer: Self-pay | Admitting: Nurse Practitioner

## 2021-11-22 ENCOUNTER — Other Ambulatory Visit: Payer: Self-pay | Admitting: Nurse Practitioner

## 2021-11-22 DIAGNOSIS — J069 Acute upper respiratory infection, unspecified: Secondary | ICD-10-CM

## 2021-11-22 DIAGNOSIS — J309 Allergic rhinitis, unspecified: Secondary | ICD-10-CM

## 2021-11-22 MED ORDER — DEXAMETHASONE 4 MG PO TABS: ORAL_TABLET | ORAL | 0 refills | Status: DC

## 2021-11-22 NOTE — Telephone Encounter (Signed)
Please call to see if she wants it mailed

## 2021-11-22 NOTE — Telephone Encounter (Signed)
Can you please get her a note stating work from home this week - has a URI

## 2021-11-28 NOTE — Progress Notes (Signed)
Assessment and Plan:  Margaret Bartlett was seen today for acute visit.  Diagnoses and all orders for this visit:  URI, acute -     azithromycin (ZITHROMAX) 250 MG tablet; Take 2 tablets (500 mg) on  Day 1,  followed by 1 tablet (250 mg) once daily on Days 2 through 5. -     benzonatate (TESSALON PERLES) 100 MG capsule; Take 2 capsules (200 mg total) by mouth 3 (three) times daily as needed for cough (Max: 600mg  per day). Push fluids Continue Mucinex and change from Xyzal to Allegra or Zyrtec If not improving call the office       Further disposition pending results of labs. Discussed med's effects and SE's.   Over 30 minutes of exam, counseling, chart review, and critical decision making was performed.   Future Appointments  Date Time Provider Department Center  01/09/2022  2:00 PM 01/11/2022, NP [facility] None    ------------------------------------------------------------------------------------------------------------------   HPI BP 118/68    Pulse (!) 102    Temp 97.9 F (36.6 C)    Wt 137 lb 12.8 oz (62.5 kg)    SpO2 99%    BMI 19.77 kg/m  61 y.o.female presents for headaches, sore throat, dry cough, congestion with yellow and blood tinged mucus, headache and sore throat.  Denies nausea/vomiting,diarrhea and myalgias currently  Past Medical History:  Diagnosis Date   Allergy    Anemia    Migraine    Vitamin D deficiency      No Known Allergies  Current Outpatient Medications on File Prior to Visit  Medication Sig   baclofen (LIORESAL) 10 MG tablet Take 1 tablet (10 mg total) by mouth daily as needed for muscle spasms.   cyclobenzaprine (FLEXERIL) 10 MG tablet Take 1 tablet (10 mg total) by mouth at bedtime as needed for muscle spasms.   estradiol (VIVELLE-DOT) 0.075 MG/24HR APPLY AND CHANGE 1 PATCH ONTO THE SKIN 2 (TWO) TIMES A WEEK.   fluticasone (FLONASE) 50 MCG/ACT nasal spray Place 1 spray into both nostrils daily.   meclizine (ANTIVERT) 25 MG tablet 1/2-1 pill up  to 3 times daily for vertigo/nausea   meloxicam (MOBIC) 15 MG tablet TAKE 1/2 TO 1 TABLET DAILY AS NEEDED WITH FOOD FOR PAIN & INFLAMMATION   OSPHENA 60 MG TABS Take 1 tablet by mouth daily.   acetic acid-hydrocortisone (VOSOL-HC) OTIC solution Place 3 drops into the left ear 2 (two) times daily. (Patient not taking: Reported on 04/29/2021)   azelastine (ASTELIN) 0.1 % nasal spray Place 2 sprays into both nostrils 2 (two) times daily. Use in each nostril as directed   dexamethasone (DECADRON) 4 MG tablet Take 1 tab 3 x day - 3 days, then 2 x day - 3 days, then 1 tab daily (Patient not taking: Reported on 11/29/2021)   estradiol (ESTRACE) 0.1 MG/GM vaginal cream  (Patient not taking: Reported on 04/29/2021)   levocetirizine (XYZAL) 5 MG tablet Take 1 tablet (5 mg total) by mouth every evening.   SUMAtriptan (IMITREX) 100 MG tablet Take 1 tablet (100 mg total) by mouth once as needed for migraine. May repeat in 2 hours if headache persists or recurs.   No current facility-administered medications on file prior to visit.    ROS: all negative except above.   Physical Exam:  BP 118/68    Pulse (!) 102    Temp 97.9 F (36.6 C)    Wt 137 lb 12.8 oz (62.5 kg)    SpO2 99%  BMI 19.77 kg/m   General Appearance: Well nourished, in no apparent distress. Eyes: PERRLA, EOMs, conjunctiva no swelling or erythema Sinuses: positive frontal tenderness ENT/Mouth: Ext aud canals clear, TMs without erythema, bulging. No erythema, swelling, or exudate on post pharynx.  Tonsils not swollen or erythematous. Hearing normal.  Neck: Supple, thyroid normal.  Respiratory: Respiratory effort normal, BS equal bilaterally without rales, rhonchi, wheezing or stridor.  Cardio: RRR with no MRGs. Brisk peripheral pulses without edema.  Abdomen: Soft, + BS.  Non tender, no guarding, rebound, hernias, masses. Lymphatics: Non tender without lymphadenopathy.  Musculoskeletal: Full ROM, 5/5 strength, normal gait.  Skin: Warm,  dry without rashes, lesions, ecchymosis.  Neuro: Cranial nerves intact. Normal muscle tone, no cerebellar symptoms. Sensation intact.  Psych: Awake and oriented X 3, normal affect, Insight and Judgment appropriate.     Revonda Humphrey, NP 11:40 AM [name]

## 2021-11-29 ENCOUNTER — Encounter: Payer: Self-pay | Admitting: Nurse Practitioner

## 2021-11-29 ENCOUNTER — Other Ambulatory Visit: Payer: Self-pay

## 2021-11-29 ENCOUNTER — Ambulatory Visit (INDEPENDENT_AMBULATORY_CARE_PROVIDER_SITE_OTHER): Payer: BC Managed Care – PPO | Admitting: Nurse Practitioner

## 2021-11-29 VITALS — BP 118/68 | HR 102 | Temp 97.9°F | Wt 137.8 lb

## 2021-11-29 DIAGNOSIS — J069 Acute upper respiratory infection, unspecified: Secondary | ICD-10-CM | POA: Diagnosis not present

## 2021-11-29 MED ORDER — AZITHROMYCIN 250 MG PO TABS: ORAL_TABLET | ORAL | 1 refills | Status: DC

## 2021-11-29 MED ORDER — BENZONATATE 100 MG PO CAPS: 200.0000 mg | ORAL_CAPSULE | Freq: Three times a day (TID) | ORAL | 0 refills | Status: DC | PRN

## 2021-11-30 ENCOUNTER — Ambulatory Visit: Payer: BC Managed Care – PPO | Admitting: Nurse Practitioner

## 2021-11-30 ENCOUNTER — Ambulatory Visit: Payer: BC Managed Care – PPO | Admitting: Internal Medicine

## 2021-12-13 ENCOUNTER — Encounter: Payer: BC Managed Care – PPO | Admitting: Adult Health Nurse Practitioner

## 2021-12-25 ENCOUNTER — Other Ambulatory Visit: Payer: Self-pay | Admitting: Adult Health

## 2022-01-04 NOTE — Progress Notes (Signed)
Complete Physical  Assessment and Plan: Multinodular goiter - TSH - goiter is stable  H/O keloid of skin Avoid procedures.   Other migraine without status migrainosus, not intractable - continue baclofen and Imitrex PRN -Monitor symptoms   Vitamin D deficiency Encourage Vit D supplementation to maintain value in therapeutic level of 60-100  - Vit D  25 hydroxy (rtn osteoporosis monitoring)  Anemia, unspecified anemia type - Iron and TIBC - Ferritin  Allergy, subsequent encounter Continue medications  Screening cholesterol level - Lipid panel  Screening for blood or protein in urine - Urinalysis, Routine w reflex microscopic (not at [facility]) - Microalbumin / creatinine urine ratio   Medication management - CBC with Differential/Platelet - CMP - Magnesium  Screening for Colon Cancer - Cologuard   Routine general medical examination at a health care facility Repeat Cologuard- ordered today Mammogram done 09/2021  Hot flashes Follow up GYN Currently on Vivelle-DOT 0.075 and Osphena  Discussed med's effects and SE's. Screening labs and tests as requested with regular follow-up as recommended.  HPI  This very nice 61 y.o. AAF presents for complete physical.   She is being following by her GYN for hot flashes. She is on vivelle-dot and Osphena.  She is not on bASA.  States hot flashes are not lasting as long and less frequent over last week.  She works for [employer] as a Occupational psychologist   Had an episode 2 weeks ago in which she had a lot of pressure at work and developed chest pressure and migraine.  Did resolve and has been asymptomatic since.   Has thyroid nodules, recent normal [place], will continue to monitor labs, no symptoms at this time.  Lab Results  Component Value Date   TSH 1.23 01/06/2021   BMI is Body mass index is 19.28 kg/m., she is working on diet and exercise. Work has been stressful. Wt Readings from Last 3 Encounters:   01/09/22 134 lb 6.4 oz (61 kg)  11/29/21 137 lb 12.8 oz (62.5 kg)  04/29/21 134 lb 11.2 oz (61.1 kg)   She had mammogram 09/2021 reported normal She had a negative cologuard 2020, due now  She does not workout but she is active walking up and down her stairs at home due to working on 2nd floor but she is more active int he summer.. No CP, SOB.  Lab Results  Component Value Date   CHOL 183 01/06/2021   HDL 85 01/06/2021   LDLCALC 85 01/06/2021   TRIG 52 01/06/2021   CHOLHDL 2.2 01/06/2021   Finally, patient has history of Vitamin D Deficiency.  She has been on 5000 IU daily.  Lab Results  Component Value Date   VD25OH 52 01/06/2021   She has a history of anemia Lab Results  Component Value Date   IRON 98 01/06/2021   TIBC 357 01/06/2021   FERRITIN 33 12/09/2019   She has a history of migraines with aura, she gets them very infrequently. Uses Imitrex rarely and Baclofen as needed,  Current Medications:   Current Outpatient Medications (Endocrine & Metabolic):    estradiol (VIVELLE-DOT) 0.075 MG/24HR, APPLY AND CHANGE 1 PATCH ONTO THE SKIN 2 (TWO) TIMES A WEEK.   OSPHENA 60 MG TABS, Take 1 tablet by mouth daily.   Current Outpatient Medications (Respiratory):    fluticasone (FLONASE) 50 MCG/ACT nasal spray, Place 1 spray into both nostrils daily.   azelastine (ASTELIN) 0.1 % nasal spray, Place 2 sprays into both nostrils 2 (two) times  daily. Use in each nostril as directed   levocetirizine (XYZAL) 5 MG tablet, Take 1 tablet (5 mg total) by mouth every evening.  Current Outpatient Medications (Analgesics):    meloxicam (MOBIC) 15 MG tablet, TAKE 1/2 TO 1 TABLET DAILY AS NEEDED WITH FOOD FOR PAIN & INFLAMMATION   SUMAtriptan (IMITREX) 100 MG tablet, Take 1 tablet (100 mg total) by mouth once as needed for migraine. May repeat in 2 hours if headache persists or recurs.   Current Outpatient Medications (Other):    baclofen (LIORESAL) 10 MG tablet, TAKE 1 TABLET BY MOUTH  DAILY AS NEEDED FOR MUSCLE SPASMS.   meclizine (ANTIVERT) 25 MG tablet, 1/2-1 pill up to 3 times daily for vertigo/nausea  Health Maintenance:   Immunization History  Administered Date(s) Administered   Influenza Inj Mdck Quad Pf 11/13/2019   Influenza Inj Mdck Quad With Preservative 09/03/2018, 09/09/2021   Influenza Split 10/03/2013   PFIZER(Purple Top)SARS-COV-2 Vaccination 02/25/2020, 03/20/2020, 10/16/2020, 10/28/2021   Tdap 02/27/2017   LMP: No LMP recorded. Patient has had a hysterectomy. Sexually Active: yes STD testing offered, declines Pap: 1 year ago with GYN has had 1 abnormal in the past MGM: 09/2021 Cologuard 01/2019 negative, due will order Last Dental Exam: Dr. Mariel Sleet Last Eye Exam: Dr. Forestine Na. ellington CT head 01/2015  She is divorced, has one 58 year old daughter who is a doctor running COVID unit in [place] has68 year old grandson, Tamika, and 87 year old granddaughter naomi.   Medical History:  Past Medical History:  Diagnosis Date   Allergy    Anemia    Migraine    Vitamin D deficiency    Allergies No Known Allergies  SURGICAL HISTORY She  has a past surgical history that includes Abdominal hysterectomy (1991). FAMILY HISTORY Her family history includes Brain cancer in her mother; Depression in her father; Mental illness in her father; Stroke in her father. SOCIAL HISTORY She  reports that she has never smoked. She has never used smokeless tobacco. She reports current alcohol use. She reports that she does not use drugs.  Review of Systems: Review of Systems  Constitutional: Negative.  Negative for chills and fever.  HENT:  Negative for congestion, ear discharge, ear pain, hearing loss, nosebleeds, sinus pain, sore throat and tinnitus.   Eyes: Negative.  Negative for blurred vision and double vision.  Respiratory: Negative.  Negative for cough, hemoptysis, sputum production, shortness of breath, wheezing and stridor.   Cardiovascular: Negative.   Negative for chest pain, palpitations and leg swelling.  Gastrointestinal:  Positive for constipation (relieved with Miralax). Negative for abdominal pain, diarrhea, heartburn, nausea and vomiting.  Genitourinary:  Negative for dysuria, flank pain, frequency, hematuria and urgency.  Musculoskeletal:  Negative for back pain, falls, joint pain, myalgias and neck pain.  Skin: Negative.  Negative for rash.  Neurological:  Negative for dizziness, tingling, tremors, sensory change, speech change, focal weakness, seizures, loss of consciousness, weakness and headaches.  Endo/Heme/Allergies: Negative.  Does not bruise/bleed easily.  Psychiatric/Behavioral: Negative.  Negative for depression and suicidal ideas. The patient is not nervous/anxious and does not have insomnia.    Physical Exam: Estimated body mass index is 19.28 kg/m as calculated from the following:   Height as of this encounter: 5\' 10"  (1.778 m).   Weight as of this encounter: 134 lb 6.4 oz (61 kg). BP 100/62    Pulse 61    Temp 97.7 F (36.5 C)    Ht 5\' 10"  (1.778 m)  Wt 134 lb 6.4 oz (61 kg)    SpO2 97%    BMI 19.28 kg/m  General Appearance: Well nourished female in no apparent distress.  Eyes: PERRLA, EOMs, conjunctiva no swelling or erythema, normal fundi and vessels.  Sinuses: No Frontal/maxillary tenderness  ENT/Mouth: Ext aud canals clear, normal light reflex with TMs without erythema, bulging. Good dentition. No erythema, swelling, or exudate on post pharynx. Tonsils not swollen or erythematous. Hearing normal.  Neck: Supple, thyroid enlarged with nodules. No bruits  Respiratory: Respiratory effort normal, BS equal bilaterally without rales, rhonchi, wheezing or stridor.  Cardio: RRR without murmurs, rubs or gallops. Brisk peripheral pulses without edema.  Chest: symmetric, with normal excursions and percussion.  Breasts: defer  Abdomen: Soft, nontender, no guarding, rebound, hernias, masses, or organomegaly.  Lymphatics:  Non tender without lymphadenopathy.  Genitourinary: defer Musculoskeletal: Full ROM all peripheral extremities,5/5 strength, and normal gait.  Skin: Warm, dry without rashes, lesions, ecchymosis. Neuro: Cranial nerves intact, reflexes equal bilaterally. Normal muscle tone, no cerebellar symptoms. Sensation intact.  Psych: Awake and oriented X 3, normal affect, Insight and Judgment appropriate.   EKG: NSR, no ST changes  Shalayna Ornstein W Rylan Bernard 2:09 PM [facility]

## 2022-01-09 ENCOUNTER — Other Ambulatory Visit: Payer: Self-pay

## 2022-01-09 ENCOUNTER — Ambulatory Visit (INDEPENDENT_AMBULATORY_CARE_PROVIDER_SITE_OTHER): Payer: BC Managed Care – PPO | Admitting: Nurse Practitioner

## 2022-01-09 ENCOUNTER — Encounter: Payer: Self-pay | Admitting: Nurse Practitioner

## 2022-01-09 VITALS — BP 100/62 | HR 61 | Temp 97.7°F | Ht 70.0 in | Wt 134.4 lb

## 2022-01-09 DIAGNOSIS — Z13220 Encounter for screening for lipoid disorders: Secondary | ICD-10-CM

## 2022-01-09 DIAGNOSIS — E559 Vitamin D deficiency, unspecified: Secondary | ICD-10-CM | POA: Diagnosis not present

## 2022-01-09 DIAGNOSIS — D649 Anemia, unspecified: Secondary | ICD-10-CM

## 2022-01-09 DIAGNOSIS — I10 Essential (primary) hypertension: Secondary | ICD-10-CM | POA: Diagnosis not present

## 2022-01-09 DIAGNOSIS — G43809 Other migraine, not intractable, without status migrainosus: Secondary | ICD-10-CM

## 2022-01-09 DIAGNOSIS — Z136 Encounter for screening for cardiovascular disorders: Secondary | ICD-10-CM | POA: Diagnosis not present

## 2022-01-09 DIAGNOSIS — Z0000 Encounter for general adult medical examination without abnormal findings: Secondary | ICD-10-CM

## 2022-01-09 DIAGNOSIS — Z1389 Encounter for screening for other disorder: Secondary | ICD-10-CM

## 2022-01-09 DIAGNOSIS — Z131 Encounter for screening for diabetes mellitus: Secondary | ICD-10-CM

## 2022-01-09 DIAGNOSIS — T7840XD Allergy, unspecified, subsequent encounter: Secondary | ICD-10-CM

## 2022-01-09 DIAGNOSIS — Z1329 Encounter for screening for other suspected endocrine disorder: Secondary | ICD-10-CM

## 2022-01-09 DIAGNOSIS — Z1211 Encounter for screening for malignant neoplasm of colon: Secondary | ICD-10-CM

## 2022-01-09 DIAGNOSIS — Z79899 Other long term (current) drug therapy: Secondary | ICD-10-CM | POA: Diagnosis not present

## 2022-01-09 DIAGNOSIS — Z130 Encounter for screening for diseases of the blood and blood-forming organs and certain disorders involving the immune mechanism: Secondary | ICD-10-CM

## 2022-01-09 DIAGNOSIS — Z0001 Encounter for general adult medical examination with abnormal findings: Secondary | ICD-10-CM

## 2022-01-09 DIAGNOSIS — E042 Nontoxic multinodular goiter: Secondary | ICD-10-CM

## 2022-01-09 DIAGNOSIS — Z872 Personal history of diseases of the skin and subcutaneous tissue: Secondary | ICD-10-CM

## 2022-01-09 NOTE — Patient Instructions (Signed)

## 2022-01-10 LAB — FERRITIN: Ferritin: 40 ng/mL (ref 16–288)

## 2022-01-10 LAB — IRON, TOTAL/TOTAL IRON BINDING CAP
%SAT: 30 % (calc) (ref 16–45)
Iron: 113 ug/dL (ref 45–160)
TIBC: 383 mcg/dL (calc) (ref 250–450)

## 2022-01-10 LAB — URINALYSIS, ROUTINE W REFLEX MICROSCOPIC
Bilirubin Urine: NEGATIVE
Glucose, UA: NEGATIVE
Hgb urine dipstick: NEGATIVE
Ketones, ur: NEGATIVE
Leukocytes,Ua: NEGATIVE
Nitrite: NEGATIVE
Protein, ur: NEGATIVE
Specific Gravity, Urine: 1.014 (ref 1.001–1.035)
pH: 7 (ref 5.0–8.0)

## 2022-01-10 LAB — CBC WITH DIFFERENTIAL/PLATELET
Absolute Monocytes: 403 cells/uL (ref 200–950)
Basophils Absolute: 38 cells/uL (ref 0–200)
Basophils Relative: 0.8 %
Eosinophils Absolute: 38 cells/uL (ref 15–500)
Eosinophils Relative: 0.8 %
HCT: 35 % (ref 35.0–45.0)
Hemoglobin: 11.9 g/dL (ref 11.7–15.5)
Lymphs Abs: 2141 cells/uL (ref 850–3900)
MCH: 31.5 pg (ref 27.0–33.0)
MCHC: 34 g/dL (ref 32.0–36.0)
MCV: 92.6 fL (ref 80.0–100.0)
MPV: 10.7 fL (ref 7.5–12.5)
Monocytes Relative: 8.4 %
Neutro Abs: 2179 cells/uL (ref 1500–7800)
Neutrophils Relative %: 45.4 %
Platelets: 244 10*3/uL (ref 140–400)
RBC: 3.78 10*6/uL — ABNORMAL LOW (ref 3.80–5.10)
RDW: 12.3 % (ref 11.0–15.0)
Total Lymphocyte: 44.6 %
WBC: 4.8 10*3/uL (ref 3.8–10.8)

## 2022-01-10 LAB — VITAMIN D 25 HYDROXY (VIT D DEFICIENCY, FRACTURES): Vit D, 25-Hydroxy: 37 ng/mL (ref 30–100)

## 2022-01-10 LAB — MICROALBUMIN / CREATININE URINE RATIO
Creatinine, Urine: 82 mg/dL (ref 20–275)
Microalb Creat Ratio: 13 mcg/mg creat (ref <30)
Microalb, Ur: 1.1 mg/dL

## 2022-01-10 LAB — COMPLETE METABOLIC PANEL WITH GFR
AG Ratio: 1.3 (calc) (ref 1.0–2.5)
ALT: 10 U/L (ref 6–29)
AST: 16 U/L (ref 10–35)
Albumin: 4.4 g/dL (ref 3.6–5.1)
Alkaline phosphatase (APISO): 66 U/L (ref 37–153)
BUN: 17 mg/dL (ref 7–25)
CO2: 31 mmol/L (ref 20–32)
Calcium: 9.7 mg/dL (ref 8.6–10.4)
Chloride: 101 mmol/L (ref 98–110)
Creat: 0.96 mg/dL (ref 0.50–1.05)
Globulin: 3.5 g/dL (calc) (ref 1.9–3.7)
Glucose, Bld: 80 mg/dL (ref 65–99)
Potassium: 3.9 mmol/L (ref 3.5–5.3)
Sodium: 139 mmol/L (ref 135–146)
Total Bilirubin: 0.4 mg/dL (ref 0.2–1.2)
Total Protein: 7.9 g/dL (ref 6.1–8.1)
eGFR: 67 mL/min/{1.73_m2} (ref >=60)

## 2022-01-10 LAB — LIPID PANEL
Cholesterol: 189 mg/dL (ref <200)
HDL: 83 mg/dL (ref >=50)
LDL Cholesterol (Calc): 92 mg/dL (calc)
Non-HDL Cholesterol (Calc): 106 mg/dL (calc) (ref <130)
Total CHOL/HDL Ratio: 2.3 (calc) (ref <5.0)
Triglycerides: 49 mg/dL (ref <150)

## 2022-01-10 LAB — HEMOGLOBIN A1C
Hgb A1c MFr Bld: 5.4 % of total Hgb (ref <5.7)
Mean Plasma Glucose: 108 mg/dL
eAG (mmol/L): 6 mmol/L

## 2022-01-10 LAB — TSH: TSH: 0.87 mIU/L (ref 0.40–4.50)

## 2022-01-10 LAB — MAGNESIUM: Magnesium: 2 mg/dL (ref 1.5–2.5)

## 2022-02-16 ENCOUNTER — Other Ambulatory Visit: Payer: Self-pay | Admitting: Internal Medicine

## 2022-02-16 DIAGNOSIS — M542 Cervicalgia: Secondary | ICD-10-CM

## 2022-02-26 DIAGNOSIS — Z1211 Encounter for screening for malignant neoplasm of colon: Secondary | ICD-10-CM | POA: Diagnosis not present

## 2022-03-05 LAB — COLOGUARD: COLOGUARD: NEGATIVE

## 2022-04-15 ENCOUNTER — Other Ambulatory Visit: Payer: Self-pay | Admitting: Internal Medicine

## 2022-04-18 ENCOUNTER — Other Ambulatory Visit: Payer: Self-pay | Admitting: Internal Medicine

## 2022-07-18 ENCOUNTER — Other Ambulatory Visit: Payer: Self-pay | Admitting: Internal Medicine

## 2022-08-31 DIAGNOSIS — Z23 Encounter for immunization: Secondary | ICD-10-CM | POA: Diagnosis not present

## 2022-10-30 ENCOUNTER — Other Ambulatory Visit: Payer: Self-pay | Admitting: Nurse Practitioner

## 2023-01-08 NOTE — Progress Notes (Unsigned)
 Complete Physical  Assessment and Plan: Encounter for General Adult Medical Examination with Abnormal Findings Due annually Mammogram done 09/2021  Multinodular goiter - TSH - goiter is stable  H/O keloid of skin Avoid procedures.   Other migraine without status migrainosus, not intractable - continue baclofen and Imitrex PRN -Monitor symptoms   Vitamin D deficiency Encourage Vit D supplementation to maintain value in therapeutic level of 60-100  - Vit D  25 hydroxy (rtn osteoporosis monitoring)  Anemia, unspecified anemia type - CBC  Stage 3a CKD(HCC) Increase fluids, avoid NSAIDS, monitor sugars, will monitor - CMP  Allergy, subsequent encounter Continue medications  Screening cholesterol level - Lipid panel  Screening for blood or protein in urine - Urinalysis, Routine w reflex microscopic (not at [facility]) - Microalbumin / creatinine urine ratio   Medication management - CBC with Differential/Platelet - CMP - Magnesium     Hot flashes Follow up GYN Currently on Vivelle-DOT 0.075 and Osphena  Discussed med's effects and SE's. Screening labs and tests as requested with regular follow-up as recommended.  HPI  [name] presents for complete physical.   She is being following by her GYN for hot flashes. She is on vivelle-dot and Osphena.  She is not on bASA.  States hot flashes are not lasting as long and less frequent over last week.  She works for [employer] as a Radiation protection practitioner   Had an episode 2 weeks ago in which she had a lot of pressure at work and developed chest pressure and migraine.  Did resolve and has been asymptomatic since.   Has thyroid nodules, recent normal [place], will continue to monitor labs, no symptoms at this time.  Lab Results  Component Value Date   TSH 0.87 01/09/2022   BMI is There is no height or weight on file to calculate BMI., she is working on diet and exercise. Work has been  stressful. Wt Readings from Last 3 Encounters:  01/09/22 134 lb 6.4 oz (61 kg)  11/29/21 137 lb 12.8 oz (62.5 kg)  04/29/21 134 lb 11.2 oz (61.1 kg)   She had mammogram 09/2021 reported normal She had a negative cologuard 2020, due now  She does not workout but she is active walking up and down her stairs at home due to working on 2nd floor but she is more active int he summer.. No CP, SOB.  Lab Results  Component Value Date   CHOL 189 01/09/2022   HDL 83 01/09/2022   LDLCALC 92 01/09/2022   TRIG 49 01/09/2022   CHOLHDL 2.3 01/09/2022   Finally, patient has history of Vitamin D Deficiency.  She has been on 5000 IU daily.  Lab Results  Component Value Date   VD25OH 37 01/09/2022   She has a history of anemia Lab Results  Component Value Date   IRON 113 01/09/2022   TIBC 383 01/09/2022   FERRITIN 40 01/09/2022   She has a history of migraines with aura, she gets them very infrequently. Uses Imitrex rarely and Baclofen as needed,  Current Medications:   Current Outpatient Medications (Endocrine & Metabolic):    estradiol (VIVELLE-DOT) 0.075 MG/24HR, APPLY AND CHANGE 1 PATCH ONTO THE SKIN 2 (TWO) TIMES A WEEK.   OSPHENA 60 MG TABS, Take 1 tablet by mouth daily.   Current Outpatient Medications (Respiratory):    azelastine (ASTELIN) 0.1 % nasal spray, Place 2 sprays into both nostrils 2 (two) times daily. Use in each nostril as directed  fluticasone (FLONASE) 50 MCG/ACT nasal spray, Place 1 spray into both nostrils daily.   levocetirizine (XYZAL) 5 MG tablet, Take 1 tablet (5 mg total) by mouth every evening.  Current Outpatient Medications (Analgesics):    meloxicam (MOBIC) 15 MG tablet, TAKE 1/2 TO 1 TABLET DAILY AS NEEDED WITH FOOD FOR PAIN & INFLAMMATION   SUMAtriptan (IMITREX) 100 MG tablet, Take 1 tablet (100 mg total) by mouth once as needed for migraine. May repeat in 2 hours if headache persists or recurs.   Current Outpatient Medications (Other):    baclofen  (LIORESAL) 10 MG tablet, TAKE 1 TABLET 3 X /DAY AS NEEDED FOR MUSCLE SPASMS   meclizine (ANTIVERT) 25 MG tablet, 1/2-1 pill up to 3 times daily for vertigo/nausea  Health Maintenance:   Immunization History  Administered Date(s) Administered   Influenza Inj Mdck Quad Pf 11/13/2019   Influenza Inj Mdck Quad With Preservative 09/03/2018, 09/09/2021   Influenza Split 10/03/2013   PFIZER(Purple Top)SARS-COV-2 Vaccination 02/25/2020, 03/20/2020, 10/16/2020, 10/28/2021   Tdap 02/27/2017   LMP: No LMP recorded. Patient has had a hysterectomy. Sexually Active: yes STD testing offered, declines Pap: 1 year ago with GYN has had 1 abnormal in the past MGM: 09/2021 Cologuard 01/2019 negative, due will order Last Dental Exam: Dr. Mariel Sleet Last Eye Exam: Dr. Forestine Na. [place] CT head 01/2015  She is divorced, has one 17 year old daughter who is a doctor running COVID unit in [place] has41 year old grandson, Margaret Bartlett, and 39 year old granddaughter Margaret Bartlett.   Medical History:  Past Medical History:  Diagnosis Date   Allergy    Anemia    Migraine    Vitamin D deficiency    Allergies No Known Allergies  SURGICAL HISTORY She  has a past surgical history that includes Abdominal hysterectomy (1991). FAMILY HISTORY Her family history includes Brain cancer in her mother; Depression in her father; Mental illness in her father; Stroke in her father. SOCIAL HISTORY She  reports that she has never smoked. She has never used smokeless tobacco. She reports current alcohol use. She reports that she does not use drugs.  Review of Systems: Review of Systems  Constitutional: Negative.  Negative for chills and fever.  HENT:  Negative for congestion, ear discharge, ear pain, hearing loss, nosebleeds, sinus pain, sore throat and tinnitus.   Eyes: Negative.  Negative for blurred vision and double vision.  Respiratory: Negative.  Negative for cough, hemoptysis, sputum production, shortness of breath, wheezing and  stridor.   Cardiovascular: Negative.  Negative for chest pain, palpitations and leg swelling.  Gastrointestinal:  Positive for constipation (relieved with Miralax). Negative for abdominal pain, diarrhea, heartburn, nausea and vomiting.  Genitourinary:  Negative for dysuria, flank pain, frequency, hematuria and urgency.  Musculoskeletal:  Negative for back pain, falls, joint pain, myalgias and neck pain.  Skin: Negative.  Negative for rash.  Neurological:  Negative for dizziness, tingling, tremors, sensory change, speech change, focal weakness, seizures, loss of consciousness, weakness and headaches.  Endo/Heme/Allergies: Negative.  Does not bruise/bleed easily.  Psychiatric/Behavioral: Negative.  Negative for depression and suicidal ideas. The patient is not nervous/anxious and does not have insomnia.     Physical Exam: Estimated body mass index is 19.28 kg/m as calculated from the following:   Height as of 01/09/22: 5' 10"$  (1.778 m).   Weight as of 01/09/22: 134 lb 6.4 oz (61 kg). There were no vitals taken for this visit. General Appearance: Well nourished female in no apparent distress.  Eyes: PERRLA, EOMs,  conjunctiva no swelling or erythema, normal fundi and vessels.  Sinuses: No Frontal/maxillary tenderness  ENT/Mouth: Ext aud canals clear, normal light reflex with TMs without erythema, bulging. Good dentition. No erythema, swelling, or exudate on post pharynx. Tonsils not swollen or erythematous. Hearing normal.  Neck: Supple, thyroid enlarged with nodules. No bruits  Respiratory: Respiratory effort normal, BS equal bilaterally without rales, rhonchi, wheezing or stridor.  Cardio: RRR without murmurs, rubs or gallops. Brisk peripheral pulses without edema.  Chest: symmetric, with normal excursions and percussion.  Breasts: defer  Abdomen: Soft, nontender, no guarding, rebound, hernias, masses, or organomegaly.  Lymphatics: Non tender without lymphadenopathy.  Genitourinary:  defer Musculoskeletal: Full ROM all peripheral extremities,5/5 strength, and normal gait.  Skin: Warm, dry without rashes, lesions, ecchymosis. Neuro: Cranial nerves intact, reflexes equal bilaterally. Normal muscle tone, no cerebellar symptoms. Sensation intact.  Psych: Awake and oriented X 3, normal affect, Insight and Judgment appropriate.   EKG: NSR, no ST changes  Margaret Bartlett E  12:16 PM [facility]

## 2023-01-09 ENCOUNTER — Ambulatory Visit (INDEPENDENT_AMBULATORY_CARE_PROVIDER_SITE_OTHER): Payer: BC Managed Care – PPO | Admitting: Nurse Practitioner

## 2023-01-09 ENCOUNTER — Encounter: Payer: Self-pay | Admitting: Nurse Practitioner

## 2023-01-09 VITALS — BP 110/72 | HR 94 | Temp 97.7°F | Ht 70.0 in | Wt 134.0 lb

## 2023-01-09 DIAGNOSIS — G43809 Other migraine, not intractable, without status migrainosus: Secondary | ICD-10-CM

## 2023-01-09 DIAGNOSIS — Z0000 Encounter for general adult medical examination without abnormal findings: Secondary | ICD-10-CM | POA: Diagnosis not present

## 2023-01-09 DIAGNOSIS — I10 Essential (primary) hypertension: Secondary | ICD-10-CM | POA: Diagnosis not present

## 2023-01-09 DIAGNOSIS — Z13220 Encounter for screening for lipoid disorders: Secondary | ICD-10-CM | POA: Diagnosis not present

## 2023-01-09 DIAGNOSIS — Z136 Encounter for screening for cardiovascular disorders: Secondary | ICD-10-CM | POA: Diagnosis not present

## 2023-01-09 DIAGNOSIS — Z1329 Encounter for screening for other suspected endocrine disorder: Secondary | ICD-10-CM

## 2023-01-09 DIAGNOSIS — N1831 Chronic kidney disease, stage 3a: Secondary | ICD-10-CM

## 2023-01-09 DIAGNOSIS — Z1389 Encounter for screening for other disorder: Secondary | ICD-10-CM | POA: Diagnosis not present

## 2023-01-09 DIAGNOSIS — E042 Nontoxic multinodular goiter: Secondary | ICD-10-CM

## 2023-01-09 DIAGNOSIS — D649 Anemia, unspecified: Secondary | ICD-10-CM

## 2023-01-09 DIAGNOSIS — N182 Chronic kidney disease, stage 2 (mild): Secondary | ICD-10-CM

## 2023-01-09 DIAGNOSIS — Z131 Encounter for screening for diabetes mellitus: Secondary | ICD-10-CM

## 2023-01-09 DIAGNOSIS — Z79899 Other long term (current) drug therapy: Secondary | ICD-10-CM

## 2023-01-09 DIAGNOSIS — Z872 Personal history of diseases of the skin and subcutaneous tissue: Secondary | ICD-10-CM

## 2023-01-09 DIAGNOSIS — T7840XD Allergy, unspecified, subsequent encounter: Secondary | ICD-10-CM

## 2023-01-09 DIAGNOSIS — E559 Vitamin D deficiency, unspecified: Secondary | ICD-10-CM

## 2023-01-09 DIAGNOSIS — R232 Flushing: Secondary | ICD-10-CM

## 2023-01-09 DIAGNOSIS — H6992 Unspecified Eustachian tube disorder, left ear: Secondary | ICD-10-CM

## 2023-01-09 DIAGNOSIS — I700 Atherosclerosis of aorta: Secondary | ICD-10-CM | POA: Diagnosis not present

## 2023-01-09 DIAGNOSIS — Z0001 Encounter for general adult medical examination with abnormal findings: Secondary | ICD-10-CM

## 2023-01-09 NOTE — Patient Instructions (Addendum)
YOU CAN CALL TO MAKE AN ULTRASOUND..  I have put in an order for a thyroid ultrasound for you to have You can set them up at your convenience by calling this number [phone] You will likely have the ultrasound at [place]  If you have any issues call our office and we will set this up for you.     Eustachian Tube Dysfunction  Eustachian tube dysfunction refers to a condition in which a blockage develops in the narrow passage that connects the middle ear to the back of the nose (eustachian tube). The eustachian tube regulates air pressure in the middle ear by letting air move between the ear and nose. It also helps to drain fluid from the middle ear space. Eustachian tube dysfunction can affect one or both ears. When the eustachian tube does not function properly, air pressure, fluid, or both can build up in the middle ear. What are the causes? This condition occurs when the eustachian tube becomes blocked or cannot open normally. Common causes of this condition include: Ear infections. Colds and other infections that affect the nose, mouth, and throat (upper respiratory tract). Allergies. Irritation from cigarette smoke. Irritation from stomach acid coming up into the esophagus (gastroesophageal reflux). The esophagus is the part of the body that moves food from the mouth to the stomach. Sudden changes in air pressure, such as from descending in an airplane or scuba diving. Abnormal growths in the nose or throat, such as: Growths that line the nose (nasal polyps). Abnormal growth of cells (tumors). Enlarged tissue at the back of the throat (adenoids). What increases the risk? You are more likely to develop this condition if: You smoke. You are overweight. You are a child who has: Certain birth defects of the mouth, such as cleft palate. Large tonsils or adenoids. What are the signs or symptoms? Common symptoms of this condition include: A feeling of fullness in the  ear. Ear pain. Clicking or popping noises in the ear. Ringing in the ear (tinnitus). Hearing loss. Loss of balance. Dizziness. Symptoms may get worse when the air pressure around you changes, such as when you travel to an area of high elevation, fly on an airplane, or go scuba diving. How is this diagnosed? This condition may be diagnosed based on: Your symptoms. A physical exam of your ears, nose, and throat. Tests, such as those that measure: The movement of your eardrum. Your hearing (audiometry). How is this treated? Treatment depends on the cause and severity of your condition. In mild cases, you may relieve your symptoms by moving air into your ears. This is called "popping the ears." In more severe cases, or if you have symptoms of fluid in your ears, treatment may include: Medicines to relieve congestion (decongestants). Medicines that treat allergies (antihistamines). Nasal sprays or ear drops that contain medicines that reduce swelling (steroids). A procedure to drain the fluid in your eardrum. In this procedure, a small tube may be placed in the eardrum to: Drain the fluid. Restore the air in the middle ear space. A procedure to insert a balloon device through the nose to inflate the opening of the eustachian tube (balloon dilation). Follow these instructions at home: Lifestyle Do not do any of the following until your health care provider approves: Travel to high altitudes. Fly in airplanes. Work in a Pension scheme manager or room. Scuba dive. Do not use any products that contain nicotine or tobacco. These products include cigarettes, chewing tobacco, and  vaping devices, such as e-cigarettes. If you need help quitting, ask your health care provider. Keep your ears dry. Wear fitted earplugs during showering and bathing. Dry your ears completely after. General instructions Take over-the-counter and prescription medicines only as told by your health care provider. Use  techniques to help pop your ears as recommended by your health care provider. These may include: Chewing gum. Yawning. Frequent, forceful swallowing. Closing your mouth, holding your nose closed, and gently blowing as if you are trying to blow air out of your nose. Keep all follow-up visits. This is important. Contact a health care provider if: Your symptoms do not go away after treatment. Your symptoms come back after treatment. You are unable to pop your ears. You have: A fever. Pain in your ear. Pain in your head or neck. Fluid draining from your ear. Your hearing suddenly changes. You become very dizzy. You lose your balance. Get help right away if: You have a sudden, severe increase in any of your symptoms. Summary Eustachian tube dysfunction refers to a condition in which a blockage develops in the eustachian tube. It can be caused by ear infections, allergies, inhaled irritants, or abnormal growths in the nose or throat. Symptoms may include ear pain or fullness, hearing loss, or ringing in the ears. Mild cases are treated with techniques to unblock the ears, such as yawning or chewing gum. More severe cases are treated with medicines or procedures. This information is not intended to replace advice given to you by your health care provider. Make sure you discuss any questions you have with your health care provider. Document Revised: 01/17/2021 Document Reviewed: 01/17/2021 Elsevier Patient Education  [place].

## 2023-01-10 LAB — COMPLETE METABOLIC PANEL WITH GFR
AG Ratio: 1.3 (calc) (ref 1.0–2.5)
ALT: 12 U/L (ref 6–29)
AST: 16 U/L (ref 10–35)
Albumin: 4.5 g/dL (ref 3.6–5.1)
Alkaline phosphatase (APISO): 69 U/L (ref 37–153)
BUN: 12 mg/dL (ref 7–25)
CO2: 29 mmol/L (ref 20–32)
Calcium: 9.7 mg/dL (ref 8.6–10.4)
Chloride: 103 mmol/L (ref 98–110)
Creat: 0.84 mg/dL (ref 0.50–1.05)
Globulin: 3.6 g/dL (calc) (ref 1.9–3.7)
Glucose, Bld: 82 mg/dL (ref 65–99)
Potassium: 3.9 mmol/L (ref 3.5–5.3)
Sodium: 138 mmol/L (ref 135–146)
Total Bilirubin: 0.3 mg/dL (ref 0.2–1.2)
Total Protein: 8.1 g/dL (ref 6.1–8.1)
eGFR: 78 mL/min/{1.73_m2} (ref >=60)

## 2023-01-10 LAB — CBC WITH DIFFERENTIAL/PLATELET
Absolute Monocytes: 343 cells/uL (ref 200–950)
Basophils Absolute: 31 cells/uL (ref 0–200)
Basophils Relative: 0.8 %
Eosinophils Absolute: 90 cells/uL (ref 15–500)
Eosinophils Relative: 2.3 %
HCT: 37.4 % (ref 35.0–45.0)
Hemoglobin: 12.8 g/dL (ref 11.7–15.5)
Lymphs Abs: 1880 cells/uL (ref 850–3900)
MCH: 31.6 pg (ref 27.0–33.0)
MCHC: 34.2 g/dL (ref 32.0–36.0)
MCV: 92.3 fL (ref 80.0–100.0)
MPV: 10.2 fL (ref 7.5–12.5)
Monocytes Relative: 8.8 %
Neutro Abs: 1556 cells/uL (ref 1500–7800)
Neutrophils Relative %: 39.9 %
Platelets: 250 10*3/uL (ref 140–400)
RBC: 4.05 10*6/uL (ref 3.80–5.10)
RDW: 11.9 % (ref 11.0–15.0)
Total Lymphocyte: 48.2 %
WBC: 3.9 10*3/uL (ref 3.8–10.8)

## 2023-01-10 LAB — URINALYSIS W MICROSCOPIC + REFLEX CULTURE
Bacteria, UA: NONE SEEN /HPF
Bilirubin Urine: NEGATIVE
Glucose, UA: NEGATIVE
Hgb urine dipstick: NEGATIVE
Hyaline Cast: NONE SEEN /LPF
Ketones, ur: NEGATIVE
Leukocyte Esterase: NEGATIVE
Nitrites, Initial: NEGATIVE
Protein, ur: NEGATIVE
RBC / HPF: NONE SEEN /HPF (ref 0–2)
Specific Gravity, Urine: 1.014 (ref 1.001–1.035)
Squamous Epithelial / HPF: NONE SEEN /HPF (ref <=5)
WBC, UA: NONE SEEN /HPF (ref 0–5)
pH: 6 (ref 5.0–8.0)

## 2023-01-10 LAB — MICROALBUMIN / CREATININE URINE RATIO
Creatinine, Urine: 108 mg/dL (ref 20–275)
Microalb Creat Ratio: 11 mcg/mg creat (ref <30)
Microalb, Ur: 1.2 mg/dL

## 2023-01-10 LAB — HEMOGLOBIN A1C
Hgb A1c MFr Bld: 5.5 % of total Hgb (ref <5.7)
Mean Plasma Glucose: 111 mg/dL
eAG (mmol/L): 6.2 mmol/L

## 2023-01-10 LAB — LIPID PANEL
Cholesterol: 209 mg/dL — ABNORMAL HIGH (ref <200)
HDL: 93 mg/dL (ref >=50)
LDL Cholesterol (Calc): 104 mg/dL (calc) — ABNORMAL HIGH
Non-HDL Cholesterol (Calc): 116 mg/dL (calc) (ref <130)
Total CHOL/HDL Ratio: 2.2 (calc) (ref <5.0)
Triglycerides: 42 mg/dL (ref <150)

## 2023-01-10 LAB — NO CULTURE INDICATED

## 2023-01-10 LAB — MAGNESIUM: Magnesium: 1.9 mg/dL (ref 1.5–2.5)

## 2023-01-10 LAB — TSH: TSH: 1.47 mIU/L (ref 0.40–4.50)

## 2023-01-10 LAB — VITAMIN D 25 HYDROXY (VIT D DEFICIENCY, FRACTURES): Vit D, 25-Hydroxy: 38 ng/mL (ref 30–100)

## 2023-03-01 NOTE — Progress Notes (Signed)
Assessment and Plan:  Margaret Bartlett was seen today for sinusitis.  Diagnoses and all orders for this visit:  Cough headache -     POC Influenza A&B (Binax test)- negative -     POC COVID-19- negative  Allergy, subsequent encounter Continue Xyzal and Flonase  Acute maxillary sinusitis, recurrence not specified Push fluids Mucinex as needed Z- pak as directed -     azithromycin (ZITHROMAX) 250 MG tablet; Take 2 tablets (500 mg) on  Day 1,  followed by 1 tablet (250 mg) once daily on Days 2 through 5.       Further disposition pending results of labs. Discussed med's effects and SE's.   Over 30 minutes of exam, counseling, chart review, and critical decision making was performed.   Future Appointments  Date Time Provider Department Center  01/10/2024  2:00 PM Raynelle Dick, NP [facility] None    ------------------------------------------------------------------------------------------------------------------   HPI BP 118/76   Pulse 84   Temp 97.9 F (36.6 C)   Resp 17   Ht 5\' 10"  (1.778 m)   Wt 137 lb 9.6 oz (62.4 kg)   SpO2 99%   BMI 19.74 kg/m   62 y.o.female presents for sinus congestion and sore throat, unable to get mucus from her nose.  Right side of face/maxillary is swollen that has been present x 4 days.    She does have allergic rhinitis and is on Xyzal and Flonase daily.   Past Medical History:  Diagnosis Date   Allergy    Anemia    Migraine    Vitamin D deficiency      No Known Allergies  Current Outpatient Medications on File Prior to Visit  Medication Sig   baclofen (LIORESAL) 10 MG tablet TAKE 1 TABLET 3 X /DAY AS NEEDED FOR MUSCLE SPASMS   diphenhydrAMINE HCl (BENADRYL PO) Take by mouth.   estradiol (VIVELLE-DOT) 0.075 MG/24HR APPLY AND CHANGE 1 PATCH ONTO THE SKIN 2 (TWO) TIMES A WEEK.   Fexofenadine HCl (ALLEGRA PO) Take by mouth.   fluticasone (FLONASE) 50 MCG/ACT nasal spray Place 1 spray into both nostrils daily.   meclizine (ANTIVERT)  25 MG tablet 1/2-1 pill up to 3 times daily for vertigo/nausea   meloxicam (MOBIC) 15 MG tablet TAKE 1/2 TO 1 TABLET DAILY AS NEEDED WITH FOOD FOR PAIN & INFLAMMATION   OSPHENA 60 MG TABS Take 1 tablet by mouth daily.   levocetirizine (XYZAL) 5 MG tablet Take 1 tablet (5 mg total) by mouth every evening.   No current facility-administered medications on file prior to visit.    ROS: all negative except above.   Physical Exam:  BP 118/76   Pulse 84   Temp 97.9 F (36.6 C)   Resp 17   Ht 5\' 10"  (1.778 m)   Wt 137 lb 9.6 oz (62.4 kg)   SpO2 99%   BMI 19.74 kg/m   General Appearance: Well nourished, in no apparent distress. Eyes: PERRLA, EOMs, conjunctiva no swelling or erythema Sinuses:Positive maxillary tenderness ENT/Mouth: Ext aud canals clear, L TM without erythema, bulging, small amount of fluid noted, R TM obscured by wax. No erythema, swelling, or exudate on post pharynx.  Tonsils not swollen or erythematous. Hearing normal.  Neck: Supple, thyroid normal.  Respiratory: Respiratory effort normal, BS equal bilaterally without rales, rhonchi, wheezing or stridor.  Cardio: RRR with no MRGs. Brisk peripheral pulses without edema.  Lymphatics: positive cervical adenopathy bilaterally.  Musculoskeletal: Full ROM, 5/5 strength, normal gait.  Skin: Warm, dry  without rashes, lesions, ecchymosis.  Neuro: Cranial nerves intact. Normal muscle tone, no cerebellar symptoms. Sensation intact.  Psych: Awake and oriented X 3, normal affect, Insight and Judgment appropriate.     Raynelle Dick, NP 9:33 AM [facility]

## 2023-03-02 ENCOUNTER — Other Ambulatory Visit: Payer: Self-pay

## 2023-03-02 ENCOUNTER — Ambulatory Visit (INDEPENDENT_AMBULATORY_CARE_PROVIDER_SITE_OTHER): Payer: BC Managed Care – PPO | Admitting: Nurse Practitioner

## 2023-03-02 ENCOUNTER — Encounter: Payer: Self-pay | Admitting: Nurse Practitioner

## 2023-03-02 VITALS — BP 118/76 | HR 84 | Temp 97.9°F | Resp 17 | Ht 70.0 in | Wt 137.6 lb

## 2023-03-02 DIAGNOSIS — Z1152 Encounter for screening for COVID-19: Secondary | ICD-10-CM | POA: Diagnosis not present

## 2023-03-02 DIAGNOSIS — G4483 Primary cough headache: Secondary | ICD-10-CM

## 2023-03-02 DIAGNOSIS — J0100 Acute maxillary sinusitis, unspecified: Secondary | ICD-10-CM | POA: Diagnosis not present

## 2023-03-02 DIAGNOSIS — T7840XD Allergy, unspecified, subsequent encounter: Secondary | ICD-10-CM

## 2023-03-02 LAB — POC INFLUENZA A&B (BINAX/QUICKVUE)
Influenza A, POC: NEGATIVE
Influenza B, POC: NEGATIVE

## 2023-03-02 LAB — POC COVID19 BINAXNOW: SARS Coronavirus 2 Ag: NEGATIVE

## 2023-03-02 MED ORDER — AZITHROMYCIN 250 MG PO TABS: ORAL_TABLET | ORAL | 1 refills | Status: DC

## 2023-03-02 NOTE — Patient Instructions (Signed)

## 2023-03-09 ENCOUNTER — Encounter: Payer: Self-pay | Admitting: Nurse Practitioner

## 2023-03-09 ENCOUNTER — Other Ambulatory Visit: Payer: Self-pay | Admitting: Nurse Practitioner

## 2023-03-09 DIAGNOSIS — J0100 Acute maxillary sinusitis, unspecified: Secondary | ICD-10-CM

## 2023-03-09 MED ORDER — DEXAMETHASONE 1 MG PO TABS
ORAL_TABLET | ORAL | 0 refills | Status: DC
Start: 2023-03-09 — End: 2023-08-16

## 2023-03-28 DIAGNOSIS — N809 Endometriosis, unspecified: Secondary | ICD-10-CM | POA: Diagnosis not present

## 2023-03-28 DIAGNOSIS — Z01419 Encounter for gynecological examination (general) (routine) without abnormal findings: Secondary | ICD-10-CM | POA: Diagnosis not present

## 2023-03-28 DIAGNOSIS — N951 Menopausal and female climacteric states: Secondary | ICD-10-CM | POA: Diagnosis not present

## 2023-03-28 DIAGNOSIS — Z1331 Encounter for screening for depression: Secondary | ICD-10-CM | POA: Diagnosis not present

## 2023-03-28 DIAGNOSIS — N941 Unspecified dyspareunia: Secondary | ICD-10-CM | POA: Diagnosis not present

## 2023-08-15 NOTE — Progress Notes (Unsigned)
Assessment and Plan:  There are no diagnoses linked to this encounter.    Further disposition pending results of labs. Discussed med's effects and SE's.   Over 30 minutes of exam, counseling, chart review, and critical decision making was performed.   Future Appointments  Date Time Provider Department Center  08/16/2023 11:00 AM Raynelle Dick, NP [facility] None  01/10/2024  2:00 PM Raynelle Dick, NP [facility] None    ------------------------------------------------------------------------------------------------------------------   HPI There were no vitals taken for this visit. 62 y.o.female presents for  Past Medical History:  Diagnosis Date   Allergy    Anemia    Migraine    Vitamin D deficiency      No Known Allergies  Current Outpatient Medications on File Prior to Visit  Medication Sig   azithromycin (ZITHROMAX) 250 MG tablet Take 2 tablets (500 mg) on  Day 1,  followed by 1 tablet (250 mg) once daily on Days 2 through 5.   baclofen (LIORESAL) 10 MG tablet TAKE 1 TABLET 3 X /DAY AS NEEDED FOR MUSCLE SPASMS   dexamethasone (DECADRON) 1 MG tablet Take 3 tabs for 3 days, 2 tabs for 3 days 1 tab for 5 days. Take with food.   diphenhydrAMINE HCl (BENADRYL PO) Take by mouth.   estradiol (VIVELLE-DOT) 0.075 MG/24HR APPLY AND CHANGE 1 PATCH ONTO THE SKIN 2 (TWO) TIMES A WEEK.   Fexofenadine HCl (ALLEGRA PO) Take by mouth.   fluticasone (FLONASE) 50 MCG/ACT nasal spray Place 1 spray into both nostrils daily.   levocetirizine (XYZAL) 5 MG tablet Take 1 tablet (5 mg total) by mouth every evening.   meclizine (ANTIVERT) 25 MG tablet 1/2-1 pill up to 3 times daily for vertigo/nausea   meloxicam (MOBIC) 15 MG tablet TAKE 1/2 TO 1 TABLET DAILY AS NEEDED WITH FOOD FOR PAIN & INFLAMMATION   OSPHENA 60 MG TABS Take 1 tablet by mouth daily.   No current facility-administered medications on file prior to visit.    ROS: all negative except above.   Physical Exam:  There  were no vitals taken for this visit.  General Appearance: Well nourished, in no apparent distress. Eyes: PERRLA, EOMs, conjunctiva no swelling or erythema Sinuses: No Frontal/maxillary tenderness ENT/Mouth: Ext aud canals clear, TMs without erythema, bulging. No erythema, swelling, or exudate on post pharynx.  Tonsils not swollen or erythematous. Hearing normal.  Neck: Supple, thyroid normal.  Respiratory: Respiratory effort normal, BS equal bilaterally without rales, rhonchi, wheezing or stridor.  Cardio: RRR with no MRGs. Brisk peripheral pulses without edema.  Abdomen: Soft, + BS.  Non tender, no guarding, rebound, hernias, masses. Lymphatics: Non tender without lymphadenopathy.  Musculoskeletal: Full ROM, 5/5 strength, normal gait.  Skin: Warm, dry without rashes, lesions, ecchymosis.  Neuro: Cranial nerves intact. Normal muscle tone, no cerebellar symptoms. Sensation intact.  Psych: Awake and oriented X 3, normal affect, Insight and Judgment appropriate.     Raynelle Dick, NP 1:44 PM [facility]

## 2023-08-16 ENCOUNTER — Encounter: Payer: Self-pay | Admitting: Nurse Practitioner

## 2023-08-16 ENCOUNTER — Ambulatory Visit (INDEPENDENT_AMBULATORY_CARE_PROVIDER_SITE_OTHER): Payer: BC Managed Care – PPO | Admitting: Nurse Practitioner

## 2023-08-16 VITALS — BP 102/70 | HR 76 | Temp 97.5°F | Ht 70.0 in | Wt 142.8 lb

## 2023-08-16 DIAGNOSIS — E7800 Pure hypercholesterolemia, unspecified: Secondary | ICD-10-CM

## 2023-08-16 DIAGNOSIS — M62838 Other muscle spasm: Secondary | ICD-10-CM

## 2023-08-16 DIAGNOSIS — E782 Mixed hyperlipidemia: Secondary | ICD-10-CM | POA: Insufficient documentation

## 2023-08-16 NOTE — Patient Instructions (Addendum)
If muscle spasm is not improved with injection and massage notify the office  Muscle Cramps and Spasms Muscle cramps and spasms happen when muscles tighten on their own. They can be in any muscle. They happen most often in the muscles in the back of your lower leg (calf). Muscle cramps are painful. They are often stronger and last longer than muscle spasms. Muscle spasms may or may not be painful. In many cases, the cause of a muscle cramp or spasm is not known. But it may be from: Doing more work or exercise than your body is ready for. Using the muscles too much (overuse). Staying in one position for too long. Not preparing enough or having bad form when you play a sport or do an activity. Getting hurt. Other causes may include: Not enough water in your body (dehydration). Taking certain medicines. Not having enough salts and minerals in the body (electrolytes). Follow these instructions at home: Eating and drinking Drink enough fluid to keep your pee (urine) pale yellow. Eat a healthy diet to help your muscles work well. Your diet should include: Fruits and vegetables. Lean protein. Whole grains. Low-fat or nonfat dairy products. Managing pain and stiffness     Massage, stretch, and relax the muscle. Do this for a few minutes at a time. If told, put ice on the muscle. This may help if you are sore or have pain after a cramp or spasm. Put ice in a plastic bag. Place a towel between your skin and the bag. Leave the ice on for 20 minutes, 2-3 times a day. If told, put heat on tight or tense muscles. Do this as often as told by your doctor. Use the heat source that your doctor recommends, such as a moist heat pack or a heating pad. Place a towel between your skin and the heat source. Leave the heat on for 20-30 minutes. If your skin turns bright red, take off the ice or heat right away to prevent skin damage. The risk of damage is higher if you cannot feel pain, heat, or cold. Take  hot showers or baths to help relax the muscles. General instructions If you are having cramps often, avoid intense exercise for a few days. Take over-the-counter and prescription medicines only as told by your doctor. Watch for any changes in your symptoms. Contact a doctor if: Your cramps or spasms get worse or happen more often. Your cramps or spasms do not get better with time. This information is not intended to replace advice given to you by your health care provider. Make sure you discuss any questions you have with your health care provider. Document Revised: 06/27/2022 Document Reviewed: 06/27/2022 Elsevier Patient Education  2024 [employer].

## 2023-08-21 DIAGNOSIS — E042 Nontoxic multinodular goiter: Secondary | ICD-10-CM | POA: Diagnosis not present

## 2023-09-06 ENCOUNTER — Ambulatory Visit (INDEPENDENT_AMBULATORY_CARE_PROVIDER_SITE_OTHER): Payer: BC Managed Care – PPO | Admitting: Nurse Practitioner

## 2023-09-06 ENCOUNTER — Encounter: Payer: Self-pay | Admitting: Nurse Practitioner

## 2023-09-06 VITALS — BP 110/78 | HR 81 | Temp 97.9°F | Ht 70.0 in | Wt 141.4 lb

## 2023-09-06 DIAGNOSIS — Z23 Encounter for immunization: Secondary | ICD-10-CM

## 2023-09-06 DIAGNOSIS — M26609 Unspecified temporomandibular joint disorder, unspecified side: Secondary | ICD-10-CM

## 2023-09-06 DIAGNOSIS — M62838 Other muscle spasm: Secondary | ICD-10-CM

## 2023-09-06 MED ORDER — CYCLOBENZAPRINE HCL 10 MG PO TABS
10.0000 mg | ORAL_TABLET | Freq: Every day | ORAL | 1 refills | Status: AC
Start: 2023-09-06 — End: ?

## 2023-09-06 NOTE — Patient Instructions (Signed)
Jaw Range of Motion Exercises Jaw range of motion exercises are exercises that help your jaw move better. Exercises that help you have good posture (postural exercises) also help relieve jaw discomfort. These are often done along with range of motion exercises. These exercises can help prevent or improve: Trouble opening your mouth. Pain in your jaw while it is open or closed. Temporomandibular joint (TMJ) pain. Headache caused by jaw tension. Take other actions to prevent or relieve jaw pain, such as: Avoiding things that cause or increase jaw pain. This may include: Chewing gum or eating hard foods. Clenching your jaw or teeth, grinding your teeth, or keeping tension in your jaw muscles. Opening your mouth wide, such as for a big yawn. Leaning on your jaw, such as resting your jaw in your hand while leaning on a desk. Putting ice on your jaw. To do this: Put ice in a plastic bag. Place a towel between your skin and the bag. Leave the ice on for 20 minutes, 2-3 times a day. Remove the ice if your skin turns bright red. This is very important. If you cannot feel pain, heat, or cold, you have a greater risk of damage to the area. Only do jaw exercises that your health care provider recommends. Only move your jaw as far as it can comfortably go in each direction. Do not move your jaw into positions that cause pain. Range of motion exercises Repeat each of these exercises 8 times, 1-2 times a day, or as told by your health care provider. Exercise A: Forward protrusion  Push your jaw forward. Hold this position for 1-2 seconds. Let your jaw return to its normal position and rest it there for 1-2 seconds. Exercise B: Controlled opening  Stand or sit in front of a mirror. Place your tongue on the roof of your mouth, just behind your top teeth. Keeping your tongue on the roof of your mouth, slowly open and close your mouth. While you open and close your mouth, watch your jaw in the mirror. Try  to keep your jaw from moving to one side or the other. Exercise C: Right and left motion  Move your jaw right. Hold this position for 1-2 seconds. Let your jaw return to its normal position, and rest it there for 1-2 seconds. Move your jaw left. Hold this position for 1-2 seconds. Let your jaw return to its normal position, and rest it there for 1-2 seconds. Postural exercises Exercise A: Chin tucks  You can do this exercise sitting, standing, or lying down. Move your head straight back, keeping your head level. You can guide the movement by placing your fingers on your chin to push your jaw back in an even motion. You should be able to feel a double chin form at the end of the motion. Hold this position for 5 seconds. Repeat 10-15 times. Exercise B: Shoulder blade squeeze  Sit or stand. Bend your elbows to about 90 degrees, which is the shape of a capital letter "L." Keep your upper arms by your body. Squeeze your shoulder blades down and back, as though you were trying to touch your elbows behind you. Do not shrug your shoulders or move your head. Hold this position for 5 seconds. Repeat 10-15 times. Exercise C: Chest stretch  Stand in a doorway with one of your feet slightly in front of the other. This is called a staggered stance. If you cannot reach your forearms to the door frame, do this exercise in  a corner of a room. Put both of your hands and your forearms on the door frame, with your arms wide apart. Make sure your arms are at a 90-degree angle to your body. Place your hands on the door frame at the height of your elbows. Slowly move your weight onto your front foot until you feel a stretch across your chest and in the front of your shoulders. Keep your head and chest upright and keep your abdominal muscles tight. Do not lean in. Hold this position for 30 seconds. Repeat 3 times. Contact a health care provider if: You have jaw pain that is new or gets worse. You have clicking or  popping sounds while doing the exercises. Get help right away if: Your jaw is stuck in one place and you cannot move it. You cannot open or close your mouth. Summary Jaw range of motion exercises are exercises that help your jaw move better. Take actions to prevent or relieve jaw pain: limit chewing gum or eating hard foods; clenching your jaw or teeth; or leaning on your jaw, such as resting your jaw in your hand while leaning on a desk. Repeat each of the jaw range of motion exercises 8 times, 1-2 times a day, or as told by your health care provider. Contact a health care provider if you have clicking or popping sounds while doing the exercises. This information is not intended to replace advice given to you by your health care provider. Make sure you discuss any questions you have with your health care provider. Document Revised: 06/19/2021 Document Reviewed: 06/19/2021 Elsevier Patient Education  2024 [employer].

## 2023-09-06 NOTE — Progress Notes (Signed)
Assessment and Plan:  Margaret Bartlett was seen today for temporomandibular joint pain and spasms.  Diagnoses and all orders for this visit:  Muscle spasm of left shoulder Use heat and Tylenol alternating with Ibuprofen Schedule massage Switch from Baclofen to Cyclobenzaprine at bedtime If pain does not resolve notify the office -     cyclobenzaprine (FLEXERIL) 10 MG tablet; Take 1 tablet (10 mg total) by mouth at bedtime.  Flu vaccine need -     Fluzone Trivalent Flu Vaccine ([place]uli dose preparattion)  TMJ (temporomandibular joint syndrome) Use heat and Tylenol alternating with Ibuprofen If pain persists follow up with dentist      Further disposition pending results of labs. Discussed med's effects and SE's.   Over 30 minutes of exam, counseling, chart review, and critical decision making was performed.   Future Appointments  Date Time Provider Department Center  01/10/2024  2:00 PM Raynelle Dick, NP [facility] None    ------------------------------------------------------------------------------------------------------------------   HPI BP 110/78   Pulse 81   Temp 97.9 F (36.6 C)   Ht 5\' 10"  (1.778 m)   Wt 141 lb 6.4 oz (64.1 kg)   SpO2 98%   BMI 20.29 kg/m  62 y.o.female presents for persistent left shoulder pain and TMJ.  On 08/16/23 she was seen:Muscle spasm of left shoulder Continue Rest, alternate ice/heat Meloxicam and baclofen PRN Trigger point injection given today Advised massage tomorrow may also help If pain still persisting next week notify the office  She is now 6 weeks out and is still dealing with the muscle pain, has had 2 small 10 minute massages at work. She does plan to do a full hour massage. More active over the weekend and is having more intense pain. She is having TMJ pain on the left side. Dental procedure early September and then having pain with TMJ. She is under a lot of stress as her long time boyfriend has MRI of brain with white spots and  he is having a lot of headaches, no neurology appointment until 12/2023.  BP well controlled without medication BP Readings from Last 3 Encounters:  09/06/23 110/78  08/16/23 102/70  03/02/23 118/76  Denies headaches, chest pain, shortness of breath and dizziness   BMI is Body mass index is 20.29 kg/m., she has been working on diet and exercise. Wt Readings from Last 3 Encounters:  09/06/23 141 lb 6.4 oz (64.1 kg)  08/16/23 142 lb 12.8 oz (64.8 kg)  03/02/23 137 lb 9.6 oz (62.4 kg)     Past Medical History:  Diagnosis Date   Allergy    Anemia    Migraine    Vitamin D deficiency      No Known Allergies  Current Outpatient Medications on File Prior to Visit  Medication Sig   baclofen (LIORESAL) 10 MG tablet TAKE 1 TABLET 3 X /DAY AS NEEDED FOR MUSCLE SPASMS   diphenhydrAMINE HCl (BENADRYL PO) Take by mouth.   estradiol (VIVELLE-DOT) 0.075 MG/24HR APPLY AND CHANGE 1 PATCH ONTO THE SKIN 2 (TWO) TIMES A WEEK.   Fexofenadine HCl (ALLEGRA PO) Take by mouth.   fluticasone (FLONASE) 50 MCG/ACT nasal spray Place 1 spray into both nostrils daily.   meclizine (ANTIVERT) 25 MG tablet 1/2-1 pill up to 3 times daily for vertigo/nausea   meloxicam (MOBIC) 15 MG tablet TAKE 1/2 TO 1 TABLET DAILY AS NEEDED WITH FOOD FOR PAIN & INFLAMMATION   OSPHENA 60 MG TABS Take 1 tablet by mouth daily.   No current facility-administered  medications on file prior to visit.    ROS: all negative except above.   Physical Exam:  BP 110/78   Pulse 81   Temp 97.9 F (36.6 C)   Ht 5\' 10"  (1.778 m)   Wt 141 lb 6.4 oz (64.1 kg)   SpO2 98%   BMI 20.29 kg/m   General Appearance: Well nourished, in no apparent distress. Eyes: PERRLA, EOMs, conjunctiva no swelling or erythema Sinuses: No Frontal/maxillary tenderness ENT/Mouth: Ext aud canals clear, TMs without erythema, bulging. No erythema, swelling, or exudate on post pharynx.   Hearing normal. TMJ tender to palpation on left side.  Neck: Supple,  thyroid normal.  Respiratory: Respiratory effort normal, BS equal bilaterally without rales, rhonchi, wheezing or stridor.  Cardio: RRR with no MRGs. Brisk peripheral pulses without edema.  Abdomen: Soft, + BS.  Non tender, no guarding, rebound, hernias, masses. Lymphatics: Non tender without lymphadenopathy.  Musculoskeletal: Full ROM, 5/5 strength, normal gait. Large muscle spasm of left trapezius , very tender to touch  Skin: Warm, dry without rashes, lesions, ecchymosis.  Neuro: Cranial nerves intact. Normal muscle tone, no cerebellar symptoms. Sensation intact.  Psych: Awake and oriented X 3, normal affect, Insight and Judgment appropriate.     Raynelle Dick, NP 11:27 AM [name]

## 2023-10-12 NOTE — Progress Notes (Unsigned)
Assessment and Plan: Margaret Bartlett was seen today for acute visit.  Diagnoses and all orders for this visit:  Foreign body of right ear, initial encounter Ear lavage and curette used to remove some q tip but appears may still be a piece deeper in canal.  Will refer to ENT for further evaluation -     Ambulatory referral to ENT       Further disposition pending results of labs. Discussed med's effects and SE's.   Over 30 minutes of exam, counseling, chart review, and critical decision making was performed.   Future Appointments  Date Time Provider Department Center  01/10/2024  2:00 PM Raynelle Dick, NP [facility] None    ------------------------------------------------------------------------------------------------------------------   HPI BP 122/72   Pulse 99   Temp 97.7 F (36.5 C)   Ht 5\' 10"  (1.778 m)   Wt 146 lb 3.2 oz (66.3 kg)   SpO2 97%   BMI 20.98 kg/m   62 y.o.female presents for complaint of Q tip in her right ear.  She was using a Q tip on her right ear and the cotton part got stuck in her ear. She has tried to irrigate it at home but did not come out. Has been present for over a week.  Denies pain, fever or drainage from ear.   BP well controlled without medication BP Readings from Last 3 Encounters:  10/15/23 122/72  09/06/23 110/78  08/16/23 102/70  Denies headaches, chest pain, shortness of breath and dizziness  BMI is Body mass index is 20.98 kg/m., she has been working on diet and exercise. Wt Readings from Last 3 Encounters:  10/15/23 146 lb 3.2 oz (66.3 kg)  09/06/23 141 lb 6.4 oz (64.1 kg)  08/16/23 142 lb 12.8 oz (64.8 kg)    Past Medical History:  Diagnosis Date   Allergy    Anemia    Migraine    Vitamin D deficiency      No Known Allergies  Current Outpatient Medications on File Prior to Visit  Medication Sig   cyclobenzaprine (FLEXERIL) 10 MG tablet Take 1 tablet (10 mg total) by mouth at bedtime.   diphenhydrAMINE HCl (BENADRYL  PO) Take by mouth.   estradiol (VIVELLE-DOT) 0.075 MG/24HR APPLY AND CHANGE 1 PATCH ONTO THE SKIN 2 (TWO) TIMES A WEEK.   Fexofenadine HCl (ALLEGRA PO) Take by mouth.   fluticasone (FLONASE) 50 MCG/ACT nasal spray Place 1 spray into both nostrils daily.   meclizine (ANTIVERT) 25 MG tablet 1/2-1 pill up to 3 times daily for vertigo/nausea   meloxicam (MOBIC) 15 MG tablet TAKE 1/2 TO 1 TABLET DAILY AS NEEDED WITH FOOD FOR PAIN & INFLAMMATION   OSPHENA 60 MG TABS Take 1 tablet by mouth daily.   No current facility-administered medications on file prior to visit.    ROS: all negative except above.   Physical Exam:  BP 122/72   Pulse 99   Temp 97.7 F (36.5 C)   Ht 5\' 10"  (1.778 m)   Wt 146 lb 3.2 oz (66.3 kg)   SpO2 97%   BMI 20.98 kg/m   General Appearance: Well nourished, in no apparent distress. Eyes: PERRLA, EOMs, conjunctiva no swelling or erythema Sinuses: No Frontal/maxillary tenderness ENT/Mouth: Ext aud canals clear, L Tm without erythema, bulging. R TM has soft wax and what appears to be cotton from Q tip- removed ear wax with ear lavage and did get several small pieces of cotton with curette but appears to still have a white area  deeper in the canal. . No erythema, swelling, or exudate on post pharynx.  Hearing normal.  Neck: Supple, thyroid normal.  Respiratory: Respiratory effort normal, BS equal bilaterally without rales, rhonchi, wheezing or stridor.  Cardio: RRR with no MRGs. Brisk peripheral pulses without edema. Marland Kitchen Lymphatics: Non tender without lymphadenopathy.  Musculoskeletal: Full ROM, 5/5 strength, normal gait.  Skin: Warm, dry without rashes, lesions, ecchymosis.  Neuro: Cranial nerves intact. Normal muscle tone, no cerebellar symptoms. Sensation intact.  Psych: Awake and oriented X 3, normal affect, Insight and Judgment appropriate.     Raynelle Dick, NP 4:09 PM [facility]

## 2023-10-15 ENCOUNTER — Ambulatory Visit (INDEPENDENT_AMBULATORY_CARE_PROVIDER_SITE_OTHER): Payer: BC Managed Care – PPO | Admitting: Nurse Practitioner

## 2023-10-15 ENCOUNTER — Encounter: Payer: Self-pay | Admitting: Nurse Practitioner

## 2023-10-15 VITALS — BP 122/72 | HR 99 | Temp 97.7°F | Ht 70.0 in | Wt 146.2 lb

## 2023-10-15 DIAGNOSIS — T161XXA Foreign body in right ear, initial encounter: Secondary | ICD-10-CM | POA: Diagnosis not present

## 2023-10-15 NOTE — Patient Instructions (Signed)
Ear Foreign Body An ear foreign body is an object that is stuck in the ear. The object is usually stuck in the ear canal. Do not try to remove an ear foreign body by yourself. This could push the object farther into the ear. It is important to see a health care provider to have the object removed as soon as possible. What are the causes? It is common for young children to put objects into their ear canal. These may include food items, pebbles, beads, parts of toys, and any other small objects that fit into the ear. In adults, objects such as cotton swabs or hearing aid parts may get stuck in the ear canal. What are the signs or symptoms? An object in the ear may cause: Pain. Buzzing or roaring sounds. Hearing loss. Fluid or blood to come out of the ear. Nausea and vomiting. A feeling that your ear is stuffed up. How is this diagnosed? This condition may be diagnosed based on: Information you provide about what the object is and how it got stuck. Your symptoms. A physical exam. Tests of your hearing or the pressure inside your ear. How is this treated? Treatment depends on what the object is, where it is in your ear, and whether any part of your ear is injured. If your health care provider can see the object in your ear, he or she may remove it by: Using a tool, such as medical tweezers (forceps) or a suction tube (catheter). Flushing your ear with water (irrigation). This is done only if the object is not likely to get bigger when it is put in water. If your health care provider cannot see the object, or cannot remove it using one of these methods, you may need to see a specialist for removal. Treatment may also include: Antibiotic medicine taken as pills or given as ear drops. These may be prescribed to prevent infection. Cleaning and treating any injuries in your ear. Follow these instructions at home: Take over-the-counter and prescription medicines only as told by your health care  provider. If you were prescribed an antibiotic medicine, such as pills or ear drops, take or use the antibiotic as told by your health care provider. Do not stop taking or using the antibiotic even if you start to feel better. To prevent getting objects stuck in your ear: Do not put anything into your ear, including cotton swabs. Talk with your health care provider about how to clean your ears safely. Keep small objects out of the reach of young children. Teach children not to put anything into their ears. Keep all follow-up visits. This is important. Contact a health care provider if: You have a headache. You have a fever. You have pain or swelling that gets worse. You have reduced hearing in your ear. You have ringing in your ear. Get help right away if: You notice blood or fluid coming from your ear. You have sudden hearing loss. Summary An ear foreign body is an object that is stuck in the ear. Do not try to remove an ear foreign body by yourself. This can push the object farther into the ear. See a health care provider to have the object removed from your ear as soon as possible. This may keep you from developing an infection or hearing loss. This information is not intended to replace advice given to you by your health care provider. Make sure you discuss any questions you have with your health care provider. Document Revised: 12/15/2020  Document Reviewed: 12/15/2020 Elsevier Patient Education  2024 [employer].

## 2023-10-31 DIAGNOSIS — S00451A Superficial foreign body of right ear, initial encounter: Secondary | ICD-10-CM | POA: Diagnosis not present

## 2023-11-22 ENCOUNTER — Ambulatory Visit (INDEPENDENT_AMBULATORY_CARE_PROVIDER_SITE_OTHER): Payer: BC Managed Care – PPO | Admitting: Nurse Practitioner

## 2023-11-22 ENCOUNTER — Encounter: Payer: Self-pay | Admitting: Nurse Practitioner

## 2023-11-22 ENCOUNTER — Other Ambulatory Visit: Payer: Self-pay

## 2023-11-22 VITALS — BP 116/72 | HR 90 | Temp 98.0°F | Ht 70.0 in | Wt 143.0 lb

## 2023-11-22 DIAGNOSIS — J0100 Acute maxillary sinusitis, unspecified: Secondary | ICD-10-CM

## 2023-11-22 MED ORDER — AZITHROMYCIN 250 MG PO TABS
ORAL_TABLET | ORAL | 1 refills | Status: AC
Start: 2023-11-22 — End: ?

## 2023-11-22 NOTE — Progress Notes (Signed)
 Assessment and Plan:  Margaret Bartlett was seen today for an episodic visit.  Diagnoses and all order for this visit:  Acute maxillary sinusitis, recurrence not specified (Primary) Start tmt with Z-Pak Continue Mucinex  Stay well hydrated to keep mucus thin and productive.  - azithromycin  (ZITHROMAX ) 250 MG tablet; Take 2 tablets on  Day 1,  followed by 1 tablet  daily for 4 more days    for Sinusitis  /Bronchitis  Dispense: 6 each; Refill: 1   Notify office for further evaluation and treatment, questions or concerns if s/s fail to improve. The risks and benefits of my recommendations, as well as other treatment options were discussed with the patient today. Questions were answered.  Further disposition pending results of labs. Discussed med's effects and SE's.    Over 15 minutes of exam, counseling, chart review, and critical decision making was performed.   Future Appointments  Date Time Provider Department Center  01/10/2024  2:00 PM Wilkinson, Dana E, NP [facility] None    ------------------------------------------------------------------------------------------------------------------   HPI BP 116/72   Pulse 90   Temp 98 F (36.7 C)   Ht 5' 10 (1.778 m)   Wt 143 lb (64.9 kg)   SpO2 99%   BMI 20.52 kg/m    Patient complains of symptoms of a URI, possible sinusitis. Symptoms include facial pain, nasal congestion, post nasal drip, and sinus pressure. Onset of symptoms was 1 week ago, and has been unchanged since that time. Treatment to date: cough suppressants and decongestants.  Past Medical History:  Diagnosis Date   Allergy    Anemia    Migraine    Vitamin D  deficiency      No Known Allergies  Current Outpatient Medications on File Prior to Visit  Medication Sig   cyclobenzaprine  (FLEXERIL ) 10 MG tablet Take 1 tablet (10 mg total) by mouth at bedtime.   diphenhydrAMINE HCl (BENADRYL PO) Take by mouth.   estradiol  (VIVELLE -DOT) 0.075 MG/24HR APPLY AND CHANGE 1  PATCH ONTO THE SKIN 2 (TWO) TIMES A WEEK.   Fexofenadine HCl (ALLEGRA PO) Take by mouth.   fluticasone (FLONASE) 50 MCG/ACT nasal spray Place 1 spray into both nostrils daily.   meclizine  (ANTIVERT ) 25 MG tablet 1/2-1 pill up to 3 times daily for vertigo/nausea   meloxicam  (MOBIC ) 15 MG tablet TAKE 1/2 TO 1 TABLET DAILY AS NEEDED WITH FOOD FOR PAIN & INFLAMMATION   OSPHENA 60 MG TABS Take 1 tablet by mouth daily.   No current facility-administered medications on file prior to visit.    ROS: all negative except what is noted in the HPI.   Physical Exam:  BP 116/72   Pulse 90   Temp 98 F (36.7 C)   Ht 5' 10 (1.778 m)   Wt 143 lb (64.9 kg)   SpO2 99%   BMI 20.52 kg/m   General Appearance: NAD.  Awake, conversant and cooperative. Eyes: PERRLA, EOMs intact.  Sclera white.  Conjunctiva without erythema. Sinuses: Frontal/maxillary tenderness.  No nasal discharge. Nares patent.  ENT/Mouth: Ext aud canals clear.  Bilateral TMs w/DOL and without erythema or bulging. Hearing intact.  Posterior pharynx without swelling or exudate.  Tonsils without swelling or erythema.  Neck: Supple.  No masses, nodules or thyromegaly. Respiratory: Effort is regular with non-labored breathing. Breath sounds are equal bilaterally without rales, rhonchi, wheezing or stridor.  Cardio: RRR with no MRGs. Brisk peripheral pulses without edema.  Abdomen: Active BS in all four quadrants.  Soft and non-tender without guarding,  rebound tenderness, hernias or masses. Lymphatics: Non tender without lymphadenopathy.  Musculoskeletal: Full ROM, 5/5 strength, normal ambulation.  No clubbing or cyanosis. Skin: Appropriate color for ethnicity. Warm without rashes, lesions, ecchymosis, ulcers.  Neuro: CN II-XII grossly normal. Normal muscle tone without cerebellar symptoms and intact sensation.   Psych: AO X 3,  appropriate mood and affect, insight and judgment.     [facility], NP 1:54 PM [facility]

## 2023-11-22 NOTE — Patient Instructions (Signed)

## 2024-01-10 ENCOUNTER — Encounter: Payer: BC Managed Care – PPO | Admitting: Nurse Practitioner
